# Patient Record
Sex: Female | Born: 1955 | Race: White | Hispanic: No | Marital: Married | Smoking: Never smoker
Health system: Southern US, Community
[De-identification: ages and names within clinical notes are randomized; demographics above are authoritative.]

## PROBLEM LIST (undated history)

## (undated) DIAGNOSIS — M8580 Other specified disorders of bone density and structure, unspecified site: Secondary | ICD-10-CM

## (undated) DIAGNOSIS — T4145XA Adverse effect of unspecified anesthetic, initial encounter: Secondary | ICD-10-CM

## (undated) DIAGNOSIS — M810 Age-related osteoporosis without current pathological fracture: Secondary | ICD-10-CM

## (undated) DIAGNOSIS — Z9889 Other specified postprocedural states: Secondary | ICD-10-CM

## (undated) DIAGNOSIS — T8859XA Other complications of anesthesia, initial encounter: Secondary | ICD-10-CM

## (undated) DIAGNOSIS — M1990 Unspecified osteoarthritis, unspecified site: Secondary | ICD-10-CM

## (undated) DIAGNOSIS — R112 Nausea with vomiting, unspecified: Secondary | ICD-10-CM

## (undated) HISTORY — PX: BREAST SURGERY: SHX581

## (undated) HISTORY — PX: TONSILLECTOMY: SUR1361

---

## 2001-04-06 ENCOUNTER — Ambulatory Visit (HOSPITAL_COMMUNITY): Admission: RE | Admit: 2001-04-06 | Discharge: 2001-04-06 | Payer: Self-pay | Admitting: Family Medicine

## 2001-04-06 ENCOUNTER — Encounter: Payer: Self-pay | Admitting: Family Medicine

## 2001-08-23 ENCOUNTER — Other Ambulatory Visit: Admission: RE | Admit: 2001-08-23 | Discharge: 2001-08-23 | Payer: Self-pay | Admitting: Obstetrics and Gynecology

## 2002-09-25 ENCOUNTER — Other Ambulatory Visit: Admission: RE | Admit: 2002-09-25 | Discharge: 2002-09-25 | Payer: Self-pay | Admitting: Obstetrics and Gynecology

## 2004-04-06 ENCOUNTER — Other Ambulatory Visit: Admission: RE | Admit: 2004-04-06 | Discharge: 2004-04-06 | Payer: Self-pay | Admitting: Obstetrics and Gynecology

## 2005-08-23 ENCOUNTER — Other Ambulatory Visit: Admission: RE | Admit: 2005-08-23 | Discharge: 2005-08-23 | Payer: Self-pay | Admitting: Obstetrics and Gynecology

## 2007-11-10 ENCOUNTER — Emergency Department (HOSPITAL_COMMUNITY): Admission: EM | Admit: 2007-11-10 | Discharge: 2007-11-10 | Payer: Self-pay | Admitting: Family Medicine

## 2011-10-06 ENCOUNTER — Other Ambulatory Visit (HOSPITAL_COMMUNITY): Payer: Self-pay | Admitting: Internal Medicine

## 2011-10-06 ENCOUNTER — Ambulatory Visit (HOSPITAL_COMMUNITY)
Admission: RE | Admit: 2011-10-06 | Discharge: 2011-10-06 | Disposition: A | Discharge status: Home / Self Care | Payer: 59 | Source: Ambulatory Visit | Attending: Internal Medicine | Admitting: Internal Medicine

## 2011-10-06 DIAGNOSIS — T1490XA Injury, unspecified, initial encounter: Secondary | ICD-10-CM

## 2011-10-06 DIAGNOSIS — M25569 Pain in unspecified knee: Secondary | ICD-10-CM

## 2013-09-13 ENCOUNTER — Emergency Department (HOSPITAL_COMMUNITY)
Admission: EM | Admit: 2013-09-13 | Discharge: 2013-09-14 | Disposition: A | Discharge status: Home / Self Care | Payer: 59 | Attending: Emergency Medicine | Admitting: Emergency Medicine

## 2013-09-13 ENCOUNTER — Emergency Department (HOSPITAL_COMMUNITY): Payer: 59

## 2013-09-13 ENCOUNTER — Encounter (HOSPITAL_COMMUNITY): Payer: Self-pay | Admitting: Emergency Medicine

## 2013-09-13 DIAGNOSIS — W19XXXA Unspecified fall, initial encounter: Secondary | ICD-10-CM

## 2013-09-13 DIAGNOSIS — M899 Disorder of bone, unspecified: Secondary | ICD-10-CM | POA: Insufficient documentation

## 2013-09-13 DIAGNOSIS — Z79899 Other long term (current) drug therapy: Secondary | ICD-10-CM | POA: Insufficient documentation

## 2013-09-13 DIAGNOSIS — W1809XA Striking against other object with subsequent fall, initial encounter: Secondary | ICD-10-CM | POA: Insufficient documentation

## 2013-09-13 DIAGNOSIS — Y9389 Activity, other specified: Secondary | ICD-10-CM | POA: Insufficient documentation

## 2013-09-13 DIAGNOSIS — Y92009 Unspecified place in unspecified non-institutional (private) residence as the place of occurrence of the external cause: Secondary | ICD-10-CM | POA: Insufficient documentation

## 2013-09-13 DIAGNOSIS — S43036A Inferior dislocation of unspecified humerus, initial encounter: Secondary | ICD-10-CM | POA: Insufficient documentation

## 2013-09-13 DIAGNOSIS — S43004A Unspecified dislocation of right shoulder joint, initial encounter: Secondary | ICD-10-CM

## 2013-09-13 DIAGNOSIS — S43016A Anterior dislocation of unspecified humerus, initial encounter: Secondary | ICD-10-CM | POA: Insufficient documentation

## 2013-09-13 HISTORY — DX: Other specified disorders of bone density and structure, unspecified site: M85.80

## 2013-09-13 MED ORDER — KETOROLAC TROMETHAMINE 30 MG/ML IJ SOLN
30.0000 mg | Freq: Once | INTRAMUSCULAR | Status: AC
  Administered 2013-09-13: 30 mg via INTRAVENOUS
  Filled 2013-09-13: qty 1

## 2013-09-13 MED ORDER — MORPHINE SULFATE 4 MG/ML IJ SOLN
6.0000 mg | Freq: Once | INTRAMUSCULAR | Status: AC
  Administered 2013-09-13: 6 mg via INTRAVENOUS
  Filled 2013-09-13: qty 2

## 2013-09-13 MED ORDER — PROPOFOL 10 MG/ML IV BOLUS
1.0000 mg/kg | Freq: Once | INTRAVENOUS | Status: AC
  Administered 2013-09-13: 80.7 mg via INTRAVENOUS
  Filled 2013-09-13: qty 1

## 2013-09-13 MED ORDER — MORPHINE SULFATE 4 MG/ML IJ SOLN: 4.0000 mg | Freq: Once | INTRAMUSCULAR | Status: DC

## 2013-09-13 MED ORDER — OXYCODONE-ACETAMINOPHEN 5-325 MG PO TABS
2.0000 | ORAL_TABLET | Freq: Once | ORAL | Status: DC
  Filled 2013-09-13: qty 2

## 2013-09-13 NOTE — ED Provider Notes (Signed)
CSN: [id number]     Arrival date & time 09/13/13  2104 History  This chart was scribed for non-physician practitioner Junius Finner, PA-C working with Audree Camel, MD by Valera Castle, ED scribe. This patient was seen in room WA12/WA12 and the patient's care was started at 9:25 PM.    Chief Complaint  Patient presents with  . Fall  . Arm Injury    HPI HPI Comments: Shannon Andersen is a 57 y.o. female with a h/o osteopenia who presents to the Emergency Department complaining of sudden, throbbing, aching, constant right upper arm pain, with a severity of 8/10, onset 2 hours ago when she fell on stairs while unloading groceries, hitting her elbow on a concrete wall. She reports her arm was bent when she jammed her arm it into the wall, causing some shoulder and wrist pain as well, but that most of the pain is in her upper arm around her elbow. Pt reports taking left over tramadol from old injury PTA with minimal relief.    Movement and palpation make pain worse.  Difficult to get into comfortable position. She denies any head trauma, LOC, numbness, or tingling. She denies any h/o of similar injuries. She denies any fever, or any other associated symptoms. She has an allergy to Sulfa Antibiotics. She denies any other medical history. Pt is right handed.   Past Medical History  Diagnosis Date  . Osteopenia    History reviewed. No pertinent past surgical history. History reviewed. No pertinent family history. History  Substance Use Topics  . Smoking status: Never Smoker   . Smokeless tobacco: Not on file  . Alcohol Use: Not on file     Comment: rarely   OB History   Grav Para Term Preterm Abortions TAB SAB Ect Mult Living                 Review of Systems  Constitutional: Negative for fever.  Musculoskeletal:       Right elbow, wrist, and shoulder pain.   All other systems reviewed and are negative.    Allergies  Sulfa antibiotics  Home Medications   Current Outpatient Rx   Name  Route  Sig  Dispense  Refill  . meloxicam (MOBIC) 15 MG tablet   Oral   Take 15 mg by mouth daily.         Marland Kitchen venlafaxine XR (EFFEXOR-XR) 75 MG 24 hr capsule   Oral   Take 75 mg by mouth daily.         . Vitamin D, Ergocalciferol, (DRISDOL) 50000 UNITS CAPS capsule   Oral   Take 50,000 Units by mouth 2 (two) times a week.         . traMADol (ULTRAM) 50 MG tablet   Oral   Take 1 tablet (50 mg total) by mouth every 6 (six) hours as needed for pain.   20 tablet   0     Triage Vitals: BP 150/88  Pulse 79  Temp(Src) 97.6 F (36.4 C) (Oral)  Resp 22  Ht 5\' 10"  (1.778 m)  Wt 178 lb (80.74 kg)  BMI 25.54 kg/m2  SpO2 100%  Physical Exam  Nursing note and vitals reviewed. Constitutional: She appears well-developed and well-nourished. No distress.  HENT:  Head: Normocephalic and atraumatic.  Eyes: Conjunctivae are normal. No scleral icterus.  Neck: Normal range of motion.  Cardiovascular: Normal rate, regular rhythm and normal heart sounds.   Radial Pulse 2+. Cap Refill less than 2.  Pulmonary/Chest: Effort normal and breath sounds normal. No respiratory distress. She has no wheezes. She has no rales. She exhibits no tenderness.  Abdominal: Soft. Bowel sounds are normal. She exhibits no distension and no mass. There is no tenderness. There is no rebound and no guarding.  Musculoskeletal: She exhibits tenderness. She exhibits no edema.  Deformity noted to right shoulder. Right upper arm no ecchymosis or erythema. Pain with shoulder abduction. Tenderness to posterior right upper arm. Right elbow full ROM without pain. No edema or tenderness.   Neurological: She is alert.  4/5 gip strength due to pain. Normal sensation to right touch.   Skin: Skin is warm and dry. She is not diaphoretic.    ED Course  Procedures (including critical care time)  DIAGNOSTIC STUDIES: Oxygen Saturation is 100% on room air, normal by my interpretation.    COORDINATION OF CARE: 9:28  PM-Discussed treatment plan which includes Oxycodone and right shoulder and elbow x-rays with pt at bedside and pt agreed to plan.   10:16 PM - Re-checked pt's right shoulder. Deformity noted.   Labs Review Labs Reviewed - No data to display Imaging Review Dg Shoulder Right  09/13/2013   CLINICAL DATA:  Fall, right shoulder pain  EXAM: RIGHT SHOULDER - 2+ VIEW  COMPARISON:  None.  FINDINGS: There is anteroinferior dislocation of the right humeral head with respect to the glenoid. No fracture is identified. Right lung apex is clear.  IMPRESSION: Anteroinferior right humeral head dislocation.   Electronically Signed   By: Christiana Pellant M.D.   On: 09/13/2013 22:15   Dg Elbow Complete Right  09/13/2013   CLINICAL DATA:  Fall, right elbow pain  EXAM: RIGHT ELBOW - COMPLETE 3+ VIEW  COMPARISON:  None.  FINDINGS: There is no evidence of fracture, dislocation, or joint effusion. There is no evidence of arthropathy or other focal bone abnormality. Soft tissues are unremarkable  IMPRESSION: Negative.   Electronically Signed   By: Christiana Pellant M.D.   On: 09/13/2013 22:13   Dg Shoulder Right Port  09/14/2013   CLINICAL DATA:  Post reduction exam  EXAM: PORTABLE RIGHT SHOULDER - 2+ VIEW  COMPARISON:  Same date  FINDINGS: Two views demonstrate apparently location of the right humeral head with respect to the glenoid, but positioning is suboptimal.  IMPRESSION: Apparent relocation of right humeral head with respect to the glenoid.   Electronically Signed   By: Christiana Pellant M.D.   On: 09/14/2013 00:20    MDM   1. Shoulder dislocation, right, initial encounter   2. Fall at home, initial encounter    Pt presented with right shoulder dislocation after fall at home. Denies head trauma or LOC.  Denies any other injuries.  Shoulder was reduced by Dr. Criss Alvine, see his procedure note.  Pt tolerated procedure well and placed in a sling.  Advised to f/u with [facility] next week for recheck.  Rx:  tramadol.  All labs/imaging/findings discussed with patient. All questions answered and concerns addressed. Return precautions given. Pt verbalized understanding and agreement with tx plan. Vitals: unremarkable. Discharged in stable condition.    Discussed pt with attending during ED encounter and agrees with plan.   I personally performed the services described in this documentation, which was scribed in my presence. The recorded information has been reviewed and is accurate.    Junius Finner, PA-C 09/14/13 [phone]

## 2013-09-13 NOTE — ED Notes (Signed)
ACTUAL SEDATION STARTS at this time .

## 2013-09-13 NOTE — ED Notes (Signed)
PA at bedside.

## 2013-09-13 NOTE — ED Notes (Signed)
From home , pt. Stated she tripped on something on her garage this evening at 7:30 and stumbled and hit her right elbow against a concrete wall  Before hitting on the garage floor. Denies LOC. Pain is @ 10/10. No skin lacerations nor abrasions noted at this time.

## 2013-09-13 NOTE — ED Notes (Signed)
Patient transported to X-ray 

## 2013-09-13 NOTE — ED Notes (Signed)
MD at bedside. 

## 2013-09-14 MED ORDER — TRAMADOL HCL 50 MG PO TABS: 50.0000 mg | ORAL_TABLET | Freq: Four times a day (QID) | ORAL | Status: DC | PRN

## 2013-09-14 NOTE — ED Provider Notes (Signed)
Medical screening examination/treatment/procedure(s) were conducted as a shared visit with non-physician practitioner(s) and myself.  I personally evaluated the patient during the encounter   Patient with a mechanical fall resulting in a right shoulder dislocation. NV intact. Patient's shoulder reduced by me after conscious sedation with propofol. Placed in a sling and will arrange outpatient ortho f/u.   Shoulder Reduction Procedure Note  Under conscious sedation using propofol, the right shoulder was reduced with the patient sitting up. The Traction-Countertraction maneuver was used. Reduction was successful and confirmed by post reduction X-Rays. Repeat exam of the extremity reveals normal sensation and circulation.  Post-Reduction X-rays reveal that the proximal humerus is reduced in the glenoid fossa.   Audree Camel, MD 09/14/13 [phone]

## 2013-09-16 ENCOUNTER — Other Ambulatory Visit (HOSPITAL_COMMUNITY): Payer: Self-pay | Admitting: Orthopaedic Surgery

## 2013-09-16 DIAGNOSIS — S43004A Unspecified dislocation of right shoulder joint, initial encounter: Secondary | ICD-10-CM

## 2013-09-17 ENCOUNTER — Ambulatory Visit (HOSPITAL_COMMUNITY)
Admission: RE | Admit: 2013-09-17 | Discharge: 2013-09-17 | Disposition: A | Discharge status: Home / Self Care | Payer: 59 | Source: Ambulatory Visit | Attending: Orthopaedic Surgery | Admitting: Orthopaedic Surgery

## 2013-09-17 DIAGNOSIS — IMO0002 Reserved for concepts with insufficient information to code with codable children: Secondary | ICD-10-CM | POA: Insufficient documentation

## 2013-09-17 DIAGNOSIS — S42143A Displaced fracture of glenoid cavity of scapula, unspecified shoulder, initial encounter for closed fracture: Secondary | ICD-10-CM | POA: Insufficient documentation

## 2013-09-17 DIAGNOSIS — S43499A Other sprain of unspecified shoulder joint, initial encounter: Secondary | ICD-10-CM | POA: Insufficient documentation

## 2013-09-17 DIAGNOSIS — S42293A Other displaced fracture of upper end of unspecified humerus, initial encounter for closed fracture: Secondary | ICD-10-CM | POA: Insufficient documentation

## 2013-09-17 DIAGNOSIS — W19XXXA Unspecified fall, initial encounter: Secondary | ICD-10-CM | POA: Insufficient documentation

## 2013-09-17 DIAGNOSIS — S43004A Unspecified dislocation of right shoulder joint, initial encounter: Secondary | ICD-10-CM

## 2013-09-17 DIAGNOSIS — M75100 Unspecified rotator cuff tear or rupture of unspecified shoulder, not specified as traumatic: Secondary | ICD-10-CM | POA: Insufficient documentation

## 2013-09-17 DIAGNOSIS — M8569 Other cyst of bone, multiple sites: Secondary | ICD-10-CM | POA: Insufficient documentation

## 2013-09-17 DIAGNOSIS — M19019 Primary osteoarthritis, unspecified shoulder: Secondary | ICD-10-CM | POA: Insufficient documentation

## 2013-09-17 DIAGNOSIS — M25419 Effusion, unspecified shoulder: Secondary | ICD-10-CM | POA: Insufficient documentation

## 2013-09-26 ENCOUNTER — Encounter (HOSPITAL_COMMUNITY): Payer: Self-pay | Admitting: Pharmacy Technician

## 2013-09-26 NOTE — Pre-Procedure Instructions (Signed)
FRANCHELLE FOSKETT  09/26/2013   Your procedure is scheduled on: Tuesday, October 21st   Report to [facility]  2 * 3 at 11:00 AM.   Call this number if you have problems the morning of surgery: [phone]   Remember:   Do not eat food or drink liquids after midnight Monday.   Take these medicines the morning of surgery with A SIP OF WATER: Tramadol, Effexor   Do not wear jewelry, make-up or nail polish.  Do not wear lotions, powders, or perfumes. You may wear deodorant.  Do not shave underarms & legs 48 hours prior to surgery.                    Do not bring valuables to the hospital.  [facility] is not responsible for any belongings or valuables.               Contacts, dentures or bridgework may not be worn into surgery.  Leave suitcase in the car. After surgery it may be brought to your room.  For patients admitted to the hospital, discharge time is determined by your treatment team.              Name and phone number of your driver:    Special Instructions: Shower using CHG 2 nights before surgery and the night before surgery.  If you shower the day of surgery use CHG.  Use special wash - you have one bottle of CHG for all showers.  You should use approximately 1/3 of the bottle for each shower.   Please read over the following fact sheets that you were given: Pain Booklet, Coughing and Deep Breathing, MRSA Information and Surgical Site Infection Prevention

## 2013-09-27 ENCOUNTER — Encounter (HOSPITAL_COMMUNITY): Payer: Self-pay

## 2013-09-27 ENCOUNTER — Encounter (HOSPITAL_COMMUNITY)
Admission: RE | Admit: 2013-09-27 | Discharge: 2013-09-27 | Disposition: A | Discharge status: Home / Self Care | Payer: 59 | Source: Ambulatory Visit | Attending: Orthopedic Surgery | Admitting: Orthopedic Surgery

## 2013-09-27 ENCOUNTER — Encounter (HOSPITAL_COMMUNITY)
Admission: RE | Admit: 2013-09-27 | Discharge: 2013-09-27 | Disposition: A | Discharge status: Home / Self Care | Payer: 59 | Source: Ambulatory Visit | Attending: Orthopaedic Surgery | Admitting: Orthopaedic Surgery

## 2013-09-27 DIAGNOSIS — Z01812 Encounter for preprocedural laboratory examination: Secondary | ICD-10-CM | POA: Insufficient documentation

## 2013-09-27 DIAGNOSIS — Z01810 Encounter for preprocedural cardiovascular examination: Secondary | ICD-10-CM | POA: Insufficient documentation

## 2013-09-27 DIAGNOSIS — Z01818 Encounter for other preprocedural examination: Secondary | ICD-10-CM | POA: Insufficient documentation

## 2013-09-27 HISTORY — DX: Adverse effect of unspecified anesthetic, initial encounter: T41.45XA

## 2013-09-27 HISTORY — DX: Other complications of anesthesia, initial encounter: T88.59XA

## 2013-09-27 HISTORY — DX: Other specified postprocedural states: Z98.890

## 2013-09-27 HISTORY — DX: Age-related osteoporosis without current pathological fracture: M81.0

## 2013-09-27 HISTORY — DX: Other specified postprocedural states: R11.2

## 2013-09-27 LAB — COMPREHENSIVE METABOLIC PANEL
ALT: 20 U/L (ref 0–35)
AST: 17 U/L (ref 0–37)
Albumin: 3.9 g/dL (ref 3.5–5.2)
Alkaline Phosphatase: 107 U/L (ref 39–117)
BUN: 22 mg/dL (ref 6–23)
CO2: 26 mEq/L (ref 19–32)
Calcium: 9.6 mg/dL (ref 8.4–10.5)
Chloride: 106 mEq/L (ref 96–112)
Creatinine, Ser: 0.73 mg/dL (ref 0.50–1.10)
GFR calc Af Amer: >90 mL/min (ref >90)
GFR calc non Af Amer: >90 mL/min (ref >90)
Glucose, Bld: 93 mg/dL (ref 70–99)
Potassium: 4.2 mEq/L (ref 3.5–5.1)
Sodium: 142 mEq/L (ref 135–145)
Total Bilirubin: 0.2 mg/dL — ABNORMAL LOW (ref 0.3–1.2)
Total Protein: 7.4 g/dL (ref 6.0–8.3)

## 2013-09-27 LAB — URINALYSIS, ROUTINE W REFLEX MICROSCOPIC
Bilirubin Urine: NEGATIVE
Glucose, UA: NEGATIVE mg/dL
Hgb urine dipstick: NEGATIVE
Ketones, ur: NEGATIVE mg/dL
Leukocytes, UA: NEGATIVE
Nitrite: NEGATIVE
Protein, ur: NEGATIVE mg/dL
Specific Gravity, Urine: 1.022 (ref 1.005–1.030)
Urobilinogen, UA: 0.2 mg/dL (ref 0.0–1.0)
pH: 6 (ref 5.0–8.0)

## 2013-09-27 LAB — CBC
HCT: 40.7 % (ref 36.0–46.0)
Hemoglobin: 13.7 g/dL (ref 12.0–15.0)
MCH: 28.9 pg (ref 26.0–34.0)
MCHC: 33.7 g/dL (ref 30.0–36.0)
MCV: 85.9 fL (ref 78.0–100.0)
Platelets: 205 10*3/uL (ref 150–400)
RBC: 4.74 MIL/uL (ref 3.87–5.11)
RDW: 13.7 % (ref 11.5–15.5)
WBC: 8.6 10*3/uL (ref 4.0–10.5)

## 2013-09-27 LAB — APTT: aPTT: 30 seconds (ref 24–37)

## 2013-09-27 LAB — PROTIME-INR
INR: 1.02 (ref 0.00–1.49)
Prothrombin Time: 13.2 seconds (ref 11.6–15.2)

## 2013-09-27 NOTE — Progress Notes (Addendum)
No orders at PAT--consent to be done DOS....Marland KitchenMarland KitchenDA Orders received just moments from patient leaving PAT!!!  DA

## 2013-09-30 MED ORDER — CEFAZOLIN SODIUM-DEXTROSE 2-3 GM-% IV SOLR
2.0000 g | INTRAVENOUS | Status: AC
  Administered 2013-10-01: 2 g via INTRAVENOUS
  Filled 2013-09-30: qty 50

## 2013-09-30 NOTE — H&P (Signed)
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 and she had tripped in the garage and fell on to her left side striking the right side of her body against a wall.  Apparently she sustained an anteroinferior dislocation of her shoulder, went to [facility] Room, underwent a closed reduction about 3 hours or so after the episode.  She did have some numbness in the lateral aspect of her arm which has continued.  She was placed into a sling and given tramadol.  Saw in office on 09/16/2013 and an MRI was ordered and she was seen in follow-up.    MRI scan reveals that she has a bony Bankart.  It is about 2 cm long and maybe a 3 mm piece of bone, although it could be larger as the MRI scan often times cannot distinguish between edema and the bony fragment.  There was some debris within the joint, but the rotator cuff was intact. It was felt that the best approach would be an arthroscopic evaluation to debride the joint and then consider repairing the anterior labrum.    PAST MEDICAL HISTORY: Patient Active Problem List   Diagnosis Date Noted  . Dislocation, shoulder, anterior 10/01/2013  . 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 Osteopenia   . Complication of anesthesia   . PONV (postoperative nausea and vomiting)   . Osteoporosis     STIFFNESS IN HANDS   Past Surgical History  Procedure Laterality Date  . Breast surgery Left     BIOPSY-BENIGN     MEDICATIONS:   Prescriptions prior to admission  Medication Sig Dispense Refill  . meloxicam (MOBIC) 15 MG tablet Take 15 mg by  mouth daily.      . traMADol (ULTRAM) 50 MG tablet Take 1 tablet (50 mg total) by mouth every 6 (six) hours as needed for pain.  20 tablet  0  . venlafaxine XR (EFFEXOR-XR) 75 MG 24 hr capsule Take 75 mg by mouth daily.      . Vitamin D, Ergocalciferol, (DRISDOL) 50000 UNITS CAPS capsule Take 50,000 Units by mouth 2 (two) times a week.        ALLERGIES:   Allergies  Allergen Reactions  . Sulfa Antibiotics     REVIEW OF SYSTEMS:  A comprehensive review of systems was negative.   FAMILY HISTORY:  History reviewed. No pertinent family history.  SOCIAL HISTORY:   History  Substance Use Topics  . Smoking status: Never Smoker   . Smokeless tobacco: Never Used  . Alcohol Use: Not on file     Comment: rarely      EXAMINATION: Vital signs in last 24 hours: See chart Head is normocephalic.   Eyes:  Pupils equal, round and reactive to light and accommodation.  Extraocular intact. ENT: Ears, nose, and throat were benign.   Neck: supple, no bruits were noted.   Chest: good expansion.   Lungs: essentially clear.  Cardiac: regular rhythm and rate, normal S1, S2.  No murmurs appreciated. Pulses :  1+ bilateral and symmetric in upper extremities. Abdomen is scaphoid, soft, nontender, no masses palpable, normal bowel sounds  present. CNS:  He is oriented x3 and cranial nerves II-XII grossly intact. Breast, rectal, and genital exams: not performed and not indicated for an orthopedic evaluation. Musculoskeletal: she clinically appears to be reduced.  No sulcus sign.  She is diffusely tender about the shoulder.  She has smooth motion, but just did a limited exam.  She does have decreased sensation  the axillary nerve to the lateral humeral area.  She has good radial pulses.  Radial, ulnar, median, interosseous nerve is intact distally.  Imaging Review MRI scan reveals that she has a bony Bankart.  It is about 2 cm long and maybe a 3 mm piece of bone, although it could be larger as the MRI  scan often times cannot distinguish between edema and the bony fragment.  There was some debris within the joint, but the rotator cuff was intact.    ASSESSMENT: Traumatic dislocation Right shoulder with bony bankart lesion and labral tear Past Medical History  Diagnosis Date  . Osteopenia   . Complication of anesthesia   . PONV (postoperative nausea and vomiting)   . Osteoporosis     STIFFNESS IN HANDS    PLAN: Plan for right arthroscopic evaluation to debride the joint and then consider repairing the anterior labrum, possible SAD.  The procedure,  risks, and benefits of total knee arthroplasty were presented and reviewed. The risks including but not limited to infection, blood clots, vascular and nerve injury, stiffness,  among others were discussed. The patient acknowledged the explanation, agreed to proceed.   Shannon Andersen 10/01/2013, 12:56 PM

## 2013-10-01 ENCOUNTER — Ambulatory Visit (HOSPITAL_COMMUNITY): Payer: 59 | Admitting: Certified Registered"

## 2013-10-01 ENCOUNTER — Encounter (HOSPITAL_COMMUNITY)
Admission: RE | Disposition: A | Discharge status: Home / Self Care | Payer: Self-pay | Source: Ambulatory Visit | Attending: Orthopaedic Surgery

## 2013-10-01 ENCOUNTER — Encounter (HOSPITAL_COMMUNITY): Payer: Self-pay | Admitting: *Deleted

## 2013-10-01 ENCOUNTER — Encounter (HOSPITAL_COMMUNITY): Payer: 59 | Admitting: Certified Registered"

## 2013-10-01 ENCOUNTER — Ambulatory Visit (HOSPITAL_COMMUNITY)
Admission: RE | Admit: 2013-10-01 | Discharge: 2013-10-01 | Disposition: A | Discharge status: Home / Self Care | Payer: 59 | Source: Ambulatory Visit | Attending: Orthopaedic Surgery | Admitting: Orthopaedic Surgery

## 2013-10-01 DIAGNOSIS — S43499A Other sprain of unspecified shoulder joint, initial encounter: Secondary | ICD-10-CM | POA: Insufficient documentation

## 2013-10-01 DIAGNOSIS — Z01812 Encounter for preprocedural laboratory examination: Secondary | ICD-10-CM | POA: Insufficient documentation

## 2013-10-01 DIAGNOSIS — W19XXXA Unspecified fall, initial encounter: Secondary | ICD-10-CM | POA: Insufficient documentation

## 2013-10-01 DIAGNOSIS — S43016A Anterior dislocation of unspecified humerus, initial encounter: Secondary | ICD-10-CM

## 2013-10-01 DIAGNOSIS — Z79899 Other long term (current) drug therapy: Secondary | ICD-10-CM | POA: Insufficient documentation

## 2013-10-01 DIAGNOSIS — S43014D Anterior dislocation of right humerus, subsequent encounter: Secondary | ICD-10-CM

## 2013-10-01 DIAGNOSIS — S43439A Superior glenoid labrum lesion of unspecified shoulder, initial encounter: Secondary | ICD-10-CM

## 2013-10-01 HISTORY — PX: BANKART REPAIR: SHX5173

## 2013-10-01 SURGERY — REPAIR, SHOULDER, ARTHROSCOPIC, BANKART
Anesthesia: General | Site: Shoulder | Laterality: Right | Wound class: Clean

## 2013-10-01 MED ORDER — DEXAMETHASONE SODIUM PHOSPHATE 10 MG/ML IJ SOLN
INTRAMUSCULAR | Status: DC | PRN
  Administered 2013-10-01: 4 mg via INTRAVENOUS

## 2013-10-01 MED ORDER — BUPIVACAINE HCL (PF) 0.25 % IJ SOLN
INTRAMUSCULAR | Status: AC
  Filled 2013-10-01: qty 30

## 2013-10-01 MED ORDER — CHLORHEXIDINE GLUCONATE 4 % EX LIQD: 60.0000 mL | Freq: Every day | CUTANEOUS | Status: DC

## 2013-10-01 MED ORDER — LACTATED RINGERS IV SOLN
INTRAVENOUS | Status: DC | PRN
  Administered 2013-10-01 (×2): via INTRAVENOUS

## 2013-10-01 MED ORDER — FENTANYL CITRATE 0.05 MG/ML IJ SOLN
50.0000 ug | Freq: Once | INTRAMUSCULAR | Status: DC
  Filled 2013-10-01: qty 2

## 2013-10-01 MED ORDER — LIDOCAINE HCL (CARDIAC) 20 MG/ML IV SOLN
INTRAVENOUS | Status: DC | PRN
  Administered 2013-10-01: 80 mg via INTRAVENOUS

## 2013-10-01 MED ORDER — OXYCODONE HCL 5 MG PO TABS: 5.0000 mg | ORAL_TABLET | ORAL | Status: DC | PRN

## 2013-10-01 MED ORDER — DEXAMETHASONE SODIUM PHOSPHATE 4 MG/ML IJ SOLN
INTRAMUSCULAR | Status: DC | PRN
  Administered 2013-10-01: 4 mg

## 2013-10-01 MED ORDER — LACTATED RINGERS IV SOLN: INTRAVENOUS | Status: DC

## 2013-10-01 MED ORDER — EPHEDRINE SULFATE 50 MG/ML IJ SOLN
INTRAMUSCULAR | Status: DC | PRN
  Administered 2013-10-01: 10 mg via INTRAVENOUS
  Administered 2013-10-01 (×3): 5 mg via INTRAVENOUS
  Administered 2013-10-01: 10 mg via INTRAVENOUS

## 2013-10-01 MED ORDER — LIDOCAINE-EPINEPHRINE (PF) 1 %-1:200000 IJ SOLN
INTRAMUSCULAR | Status: AC
  Filled 2013-10-01: qty 10

## 2013-10-01 MED ORDER — SODIUM CHLORIDE 0.9 % IR SOLN
Status: DC | PRN
  Administered 2013-10-01 (×6): 3000 mL

## 2013-10-01 MED ORDER — SODIUM CHLORIDE 0.9 % IV SOLN: INTRAVENOUS | Status: DC

## 2013-10-01 MED ORDER — PHENYLEPHRINE HCL 10 MG/ML IJ SOLN
10.0000 mg | INTRAVENOUS | Status: DC | PRN
  Administered 2013-10-01: 40 ug/min via INTRAVENOUS

## 2013-10-01 MED ORDER — CHLORHEXIDINE GLUCONATE 4 % EX LIQD: 60.0000 mL | Freq: Once | CUTANEOUS | Status: DC

## 2013-10-01 MED ORDER — PHENYLEPHRINE HCL 10 MG/ML IJ SOLN
INTRAMUSCULAR | Status: DC | PRN
  Administered 2013-10-01 (×6): 40 ug via INTRAVENOUS

## 2013-10-01 MED ORDER — ROCURONIUM BROMIDE 100 MG/10ML IV SOLN
INTRAVENOUS | Status: DC | PRN
  Administered 2013-10-01: 30 mg via INTRAVENOUS

## 2013-10-01 MED ORDER — MIDAZOLAM HCL 2 MG/2ML IJ SOLN: 1.0000 mg | INTRAMUSCULAR | Status: DC | PRN

## 2013-10-01 MED ORDER — FENTANYL CITRATE 0.05 MG/ML IJ SOLN
50.0000 ug | INTRAMUSCULAR | Status: DC | PRN
  Administered 2013-10-01 (×2): 50 ug via INTRAVENOUS

## 2013-10-01 MED ORDER — MIDAZOLAM HCL 2 MG/2ML IJ SOLN
1.0000 mg | INTRAMUSCULAR | Status: DC | PRN
  Administered 2013-10-01: 2 mg via INTRAVENOUS

## 2013-10-01 MED ORDER — PROPOFOL 10 MG/ML IV BOLUS
INTRAVENOUS | Status: DC | PRN
  Administered 2013-10-01: 180 mg via INTRAVENOUS

## 2013-10-01 MED ORDER — ONDANSETRON HCL 4 MG/2ML IJ SOLN
INTRAMUSCULAR | Status: DC | PRN
  Administered 2013-10-01: 4 mg via INTRAVENOUS

## 2013-10-01 MED ORDER — ACETAMINOPHEN 500 MG PO TABS
1000.0000 mg | ORAL_TABLET | Freq: Once | ORAL | Status: AC
  Administered 2013-10-01: 1000 mg via ORAL
  Filled 2013-10-01: qty 2

## 2013-10-01 MED ORDER — BUPIVACAINE-EPINEPHRINE PF 0.5-1:200000 % IJ SOLN
INTRAMUSCULAR | Status: DC | PRN
  Administered 2013-10-01: 25 mL

## 2013-10-01 SURGICAL SUPPLY — 57 items
2.9 Suture Anchor (Anchor) ×5 IMPLANT
ANCH SUT PUSHLCK 19.5X3.5 STRL (Anchor) ×1 IMPLANT
ANCHOR PUSHLOCK PEEK 3.5X19.5 (Anchor) ×1 IMPLANT
APL SKNCLS STERI-STRIP NONHPOA (GAUZE/BANDAGES/DRESSINGS) ×1
BENZOIN TINCTURE PRP APPL 2/3 (GAUZE/BANDAGES/DRESSINGS) ×1 IMPLANT
BLADE GREAT WHITE 4.2 (BLADE) ×2 IMPLANT
BLADE SURG 11 STRL SS (BLADE) ×2 IMPLANT
BUR OVAL 4.0 (BURR) ×2 IMPLANT
BUR OVAL 6.0 (BURR) ×2 IMPLANT
CANNULA 5.75X71 LONG (CANNULA) ×1 IMPLANT
CANNULA ACUFLEX KIT 5X76 (CANNULA) ×2 IMPLANT
CANNULA TWIST IN 8.25X7CM (CANNULA) ×1 IMPLANT
CLOSURE STERI-STRIP 1/4X4 (GAUZE/BANDAGES/DRESSINGS) ×1 IMPLANT
COVER SURGICAL LIGHT HANDLE (MISCELLANEOUS) ×2 IMPLANT
DRAPE SHOULDER BEACH CHAIR (DRAPES) ×2 IMPLANT
DRAPE STERI 35X30 U-POUCH (DRAPES) ×2 IMPLANT
DRSG EMULSION OIL 3X3 NADH (GAUZE/BANDAGES/DRESSINGS) ×2 IMPLANT
DRSG PAD ABDOMINAL 8X10 ST (GAUZE/BANDAGES/DRESSINGS) ×2 IMPLANT
DURAPREP 26ML APPLICATOR (WOUND CARE) ×2 IMPLANT
ELECT REM PT RETURN 9FT ADLT (ELECTROSURGICAL) ×2
ELECTRODE REM PT RTRN 9FT ADLT (ELECTROSURGICAL) ×1 IMPLANT
FIBERSTICK 2 (SUTURE) ×1 IMPLANT
GLOVE BIOGEL PI IND STRL 8 (GLOVE) ×1 IMPLANT
GLOVE BIOGEL PI IND STRL 8.5 (GLOVE) IMPLANT
GLOVE BIOGEL PI INDICATOR 8 (GLOVE) ×1
GLOVE BIOGEL PI INDICATOR 8.5 (GLOVE) ×1
GLOVE ECLIPSE 8.0 STRL XLNG CF (GLOVE) ×2 IMPLANT
GLOVE SURG ORTHO 8.5 STRL (GLOVE) ×1 IMPLANT
GOWN PREVENTION PLUS XXLARGE (GOWN DISPOSABLE) ×2 IMPLANT
GOWN STRL NON-REIN LRG LVL3 (GOWN DISPOSABLE) ×5 IMPLANT
KIT DISPOSABLE PUSHLOCK 2.9MM (KITS) ×1 IMPLANT
KIT ROOM TURNOVER OR (KITS) ×2 IMPLANT
LABRAL TAPE ×3 IMPLANT
LASSO 90 CVE QUICKPAS (DISPOSABLE) ×1 IMPLANT
MANIFOLD NEPTUNE II (INSTRUMENTS) ×2 IMPLANT
NDL SPNL 18GX3.5 QUINCKE PK (NEEDLE) ×1 IMPLANT
NEEDLE 22X1 1/2 (OR ONLY) (NEEDLE) ×2 IMPLANT
NEEDLE SPNL 18GX3.5 QUINCKE PK (NEEDLE) ×2 IMPLANT
NS IRRIG 1000ML POUR BTL (IV SOLUTION) ×2 IMPLANT
PACK SHOULDER (CUSTOM PROCEDURE TRAY) ×2 IMPLANT
PAD ARMBOARD 7.5X6 YLW CONV (MISCELLANEOUS) ×4 IMPLANT
SET ARTHROSCOPY TUBING (MISCELLANEOUS) ×2
SET ARTHROSCOPY TUBING LN (MISCELLANEOUS) ×1 IMPLANT
SLING ARM IMMOBILIZER LRG (SOFTGOODS) ×1 IMPLANT
SPONGE GAUZE 4X4 12PLY (GAUZE/BANDAGES/DRESSINGS) ×2 IMPLANT
SUT ETHILON 4 0 PS 2 18 (SUTURE) ×2 IMPLANT
SUT MNCRL AB 3-0 PS2 18 (SUTURE) ×2 IMPLANT
SUT VIC AB 0 CT1 27 (SUTURE) ×2
SUT VIC AB 0 CT1 27XBRD ANBCTR (SUTURE) ×1 IMPLANT
SUT VICRYL 0 UR6 27IN ABS (SUTURE) ×2 IMPLANT
SYR 20ML ECCENTRIC (SYRINGE) ×2 IMPLANT
SYR CONTROL 10ML LL (SYRINGE) ×2 IMPLANT
TAPE CLOTH SURG 4X10 WHT LF (GAUZE/BANDAGES/DRESSINGS) ×1 IMPLANT
TAPE LABRALWHITE 1.5X36 (TAPE) ×1 IMPLANT
TOWEL OR 17X24 6PK STRL BLUE (TOWEL DISPOSABLE) ×2 IMPLANT
TOWEL OR 17X26 10 PK STRL BLUE (TOWEL DISPOSABLE) ×2 IMPLANT
WAND 90 DEG TURBOVAC W/CORD (SURGICAL WAND) ×2 IMPLANT

## 2013-10-01 NOTE — Preoperative (Signed)
2Beta Blockers   Reason not to administer Beta Blockers:Not Applicable 

## 2013-10-01 NOTE — Op Note (Signed)
PATIENT ID:      KENZIE THORESON  MRN:     [id number] DOB/AGE:    [birth year] / 57 y.o.       OPERATIVE REPORT    DATE OF PROCEDURE:  10/01/2013       PREOPERATIVE DIAGNOSIS:   Status Post Anteroinferior dislocation with Bony Bankart/Labral Tear. Right shoulder                                                       Estimated body mass index is 25.54 kg/(m^2) as calculated from the following:   Height as of 09/27/13: 5\' 10"  (1.778 m).   Weight as of 09/13/13: 80.74 kg (178 lb).     POSTOPERATIVE DIAGNOSIS:   Status Post Anteroinferior dislocation with bony Bankart/labral tear right shoulder                                                                      Estimated body mass index is 25.54 kg/(m^2) as calculated from the following:   Height as of 09/27/13: 5\' 10"  (1.778 m).   Weight as of 09/13/13: 80.74 kg (178 lb).     PROCEDURE:  Procedure(s): Right Shoulder Arthroscopic Debridement with Labral Repair      SURGEON:  Norlene Campbell, MD    ASSISTANT:   [name], PA-C   (Present and scrubbed throughout the case, critical for assistance with exposure, retraction, instrumentation, and closure.)          ANESTHESIA: regional and general     DRAINS: none :      TOURNIQUET TIME: * No tourniquets in log *    COMPLICATIONS:  None   CONDITION:  stable  PROCEDURE IN DETAIL: 213086   Kalik Hoare W 10/01/2013, 3:18 PM

## 2013-10-01 NOTE — H&P (Signed)
  The recent History & Physical has been reviewed. I have personally examined the patient today. There is no interval change to the documented History & Physical. The patient would like to proceed with the procedure.  Norlene Campbell W 10/01/2013,  12:54 PM

## 2013-10-01 NOTE — Transfer of Care (Signed)
Immediate Anesthesia Transfer of Care Note  Patient: Shannon Andersen  Procedure(s) Performed: Procedure(s): Right Shoulder Arthroscopic Debridement with Labral Repair (Right)  Patient Location: PACU  Anesthesia Type:GA combined with regional for post-op pain  Level of Consciousness: awake  Airway & Oxygen Therapy: Patient Spontanous Breathing and Patient connected to nasal cannula oxygen  Post-op Assessment: Report given to PACU RN, Post -op Vital signs reviewed and stable and Patient moving all extremities  Post vital signs: Reviewed and stable  Complications: No apparent anesthesia complications

## 2013-10-01 NOTE — Anesthesia Preprocedure Evaluation (Signed)
Anesthesia Evaluation  Patient identified by MRN, date of birth, ID band Patient awake    Reviewed: Allergy & Precautions, H&P , NPO status , Patient's Chart, lab work & pertinent test results  History of Anesthesia Complications (+) PONV  Airway Mallampati: I TM Distance: >3 FB Neck ROM: Full    Dental   Pulmonary  breath sounds clear to auscultation        Cardiovascular Rhythm:Regular Rate:Normal     Neuro/Psych    GI/Hepatic   Endo/Other    Renal/GU      Musculoskeletal   Abdominal   Peds  Hematology   Anesthesia Other Findings   Reproductive/Obstetrics                           Anesthesia Physical Anesthesia Plan  ASA: I  Anesthesia Plan: General   Post-op Pain Management:    Induction: Intravenous  Airway Management Planned: Oral ETT  Additional Equipment:   Intra-op Plan:   Post-operative Plan: Extubation in OR  Informed Consent: I have reviewed the patients History and Physical, chart, labs and discussed the procedure including the risks, benefits and alternatives for the proposed anesthesia with the patient or authorized representative who has indicated his/her understanding and acceptance.     Plan Discussed with: CRNA and Surgeon  Anesthesia Plan Comments:         Anesthesia Quick Evaluation

## 2013-10-01 NOTE — Anesthesia Procedure Notes (Addendum)
Anesthesia Regional Block:  Interscalene brachial plexus block  Pre-Anesthetic Checklist: ,, timeout performed, Correct Patient, Correct Site, Correct Laterality, Correct Procedure, Correct Position, site marked, Risks and benefits discussed,  Surgical consent,  Pre-op evaluation,  At surgeon's request and post-op pain management  Laterality: Right  Prep: chloraprep       Needles:  Injection technique: Single-shot  Needle Type: Echogenic Stimulator Needle     Needle Length: 5cm 5 cm Needle Gauge: 22 and 22 G    Additional Needles:  Procedures: ultrasound guided (picture in chart) and nerve stimulator Interscalene brachial plexus block  Nerve Stimulator or Paresthesia:  Response: 0.48 mA,   Additional Responses:   Narrative:  Start time: 10/01/2013 12:05 PM End time: 10/01/2013 12:12 PM Injection made incrementally with aspirations every 3 mL. Anesthesiologist: Dr Gypsy Balsam  Additional Notes: 1610-9604 R ISB POP CHG prep, sterile tech #22 stim/echo needle with stim down to .48ma and good [place] visualization-PIX in chart Marc .5% w/epi 1:200000 25cc+decadron 4mg  infiltrated Multiple neg asp No compl Dr Gypsy Balsam   Procedure Name: Intubation Date/Time: 10/01/2013 1:37 PM Performed by: Valeria Batman Pre-anesthesia Checklist: Patient identified, Emergency Drugs available, Suction available, Patient being monitored and Timeout performed Patient Re-evaluated:Patient Re-evaluated prior to inductionOxygen Delivery Method: Circle system utilized and Simple face mask Preoxygenation: Pre-oxygenation with 100% oxygen Intubation Type: IV induction Ventilation: Mask ventilation without difficulty Laryngoscope Size: Mac and 3 Grade View: Grade II Tube type: Oral Tube size: 7.0 mm Number of attempts: 1 Airway Equipment and Method: Patient positioned with wedge pillow and Stylet Placement Confirmation: ETT inserted through vocal cords under direct vision,  positive ETCO2 and  breath sounds checked- equal and bilateral Secured at: 22 cm Tube secured with: Tape Dental Injury: Teeth and Oropharynx as per pre-operative assessment

## 2013-10-01 NOTE — Anesthesia Postprocedure Evaluation (Signed)
  Anesthesia Post-op Note  Patient: Shannon Andersen  Procedure(s) Performed: Procedure(s): Right Shoulder Arthroscopic Debridement with Labral Repair (Right)  Patient Location: PACU  Anesthesia Type:GA combined with regional for post-op pain  Level of Consciousness: awake  Airway and Oxygen Therapy: Patient Spontanous Breathing  Post-op Pain: none  Post-op Assessment: Post-op Vital signs reviewed, Patient's Cardiovascular Status Stable, Respiratory Function Stable, Patent Airway, No signs of Nausea or vomiting and Pain level controlled  Post-op Vital Signs: Reviewed and stable  Complications: No apparent anesthesia complications

## 2013-10-02 NOTE — Op Note (Signed)
NAMEMarland Kitchen  Shannon, Andersen NO.:  [id number]  MEDICAL RECORD NO.:  [id number]  LOCATION:  [facility]                         FACILITY:  [facility]  PHYSICIAN:  Claude Manges. Whitfield, M.D.DATE OF BIRTH:  1956/08/29  DATE OF PROCEDURE:  10/01/2013 DATE OF DISCHARGE:  10/01/2013                              OPERATIVE REPORT   PREOPERATIVE DIAGNOSIS:  Status post anterior dislocation right shoulder with bony Bankart and labral tear.  POSTOPERATIVE DIAGNOSIS:  Status post anterior dislocation right shoulder with bony Bankart and labral tear.  PROCEDURE: 1. Diagnostic arthroscopy, right shoulder with debridement of     osteocartilaginous loose bodies. 2. Arthroscopic anterior labral repair.  SURGEON:  Claude Manges. Cleophas Dunker, MD  ASSISTANT:  Oris Drone. Petrarca, PA-C  ANESTHESIA:  General with supplemental interscalene nerve block.  COMPLICATIONS:  None.  HISTORY:  A 57 year old female who is just over 2 weeks status post anterior dislocation of the right shoulder reduced in the emergency room.  She was seen post reduction in the office with an MRI.  Follow up MRI scan revealing a large bony Bankart lesion, measuring about 3 mm in width and 2 cm in length.  It is associated with a hemarthrosis and some debris within the shoulder joint.  Based on the pathology, she is now to have an arthroscopic evaluation of the labral repair.  PROCEDURE:  Shannon Andersen was met with her husband in the holding area, identified the right shoulder as the appropriate operative site marked accordingly.  She did receive a preoperative interscalene nerve block per Anesthesia.  The patient was then transferred to room #9 and placed under general anesthesia without difficulty.  In the shoulder frame, she was placed in a semi-sitting position.  Examination of the shoulder revealed no evidence of adhesive capsulitis. I could not sublux the shoulder posteriorly and was careful about even attempting an anterior  maneuver.  The right shoulder was then prepped with a DuraPrep from the base of the neck circumferentially to about the mid forearm.  Sterile draping was performed.  A time-out was called.  A marking pen was used to outline the [employer] joint, the coracoid and acromion.  At a point a fingerbreadth posterior medial to the posterior angle acromion a small stab wound was made and the arthroscope easily placed into the shoulder joint.  There was a small hemarthrosis.  Diagnostic arthroscopy revealed considerable small osteocartilaginous loose bodies in the joint anteriorly and actually into the inferior gutter.  The head was easily maneuvered so that I could see the entire gutter and remove the small pieces.  There was a bony Bankart lesion. Part of the Bankart lesion was partially intact, the rest of it represented the loose bodies in the joint.  The lesion extended from about 1 o'clock to about the 5:30 position and did not appear to involve the biceps anchor.  There was no evidence of a rotator cuff tear.  I could see a Hill-Sachs lesion posteriorly.  We then proceeded to debride the capsule anteriorly with attached pieces of loose bone from the glenoid.  At that point, there was no further loose osteocartilaginous material.  I preceded with anterior labral repair.  Using  the Arthrex system, I inserted two 2.9 mm PushLock anchors and one 3.5 mm anchor in the anterior capsule imbricating capsule and ligament and debriding the bony edge of the glenoid to obtain good purchase and adherence of the labrum and ligament complex.  Each of the anchors were firmly secured and tight so I had a very nice repair.  The joint was then explored with evidence of a loose material, and I irrigated the joint one more time.  The posterior wound was left open.  The entry wound was closed with 3-0 Monocryl and Steri-Strips.  Sterile bulky dressing was applied followed by a sling.  The patient tolerated the  procedure well without complications.  We planned to perform this as an outpatient with oxycodone for pain.  Plan to see her in the office in 5-6 days.     Claude Manges. Cleophas Dunker, M.D.     PWW/MEDQ  D:  10/01/2013  T:  10/02/2013  Job:  [id number]

## 2013-10-07 ENCOUNTER — Encounter (HOSPITAL_COMMUNITY): Payer: Self-pay | Admitting: Orthopaedic Surgery

## 2013-10-08 ENCOUNTER — Ambulatory Visit: Payer: 59 | Attending: Orthopaedic Surgery

## 2013-10-08 DIAGNOSIS — IMO0001 Reserved for inherently not codable concepts without codable children: Secondary | ICD-10-CM | POA: Insufficient documentation

## 2013-10-08 DIAGNOSIS — M25519 Pain in unspecified shoulder: Secondary | ICD-10-CM | POA: Insufficient documentation

## 2013-10-08 DIAGNOSIS — M25619 Stiffness of unspecified shoulder, not elsewhere classified: Secondary | ICD-10-CM | POA: Insufficient documentation

## 2013-10-14 ENCOUNTER — Ambulatory Visit: Payer: 59 | Attending: Orthopaedic Surgery | Admitting: Rehabilitation

## 2013-10-14 DIAGNOSIS — M25519 Pain in unspecified shoulder: Secondary | ICD-10-CM | POA: Insufficient documentation

## 2013-10-14 DIAGNOSIS — IMO0001 Reserved for inherently not codable concepts without codable children: Secondary | ICD-10-CM | POA: Insufficient documentation

## 2013-10-14 DIAGNOSIS — M25619 Stiffness of unspecified shoulder, not elsewhere classified: Secondary | ICD-10-CM | POA: Insufficient documentation

## 2013-10-17 ENCOUNTER — Ambulatory Visit: Payer: 59

## 2013-10-21 ENCOUNTER — Encounter: Payer: 59 | Admitting: Rehabilitation

## 2013-10-22 ENCOUNTER — Ambulatory Visit: Payer: 59 | Admitting: Rehabilitation

## 2013-10-24 ENCOUNTER — Ambulatory Visit: Payer: 59 | Admitting: Rehabilitation

## 2013-10-28 ENCOUNTER — Ambulatory Visit: Payer: 59 | Admitting: Rehabilitation

## 2013-10-31 ENCOUNTER — Ambulatory Visit: Payer: 59 | Admitting: Rehabilitation

## 2013-11-04 ENCOUNTER — Ambulatory Visit: Payer: 59

## 2013-11-06 ENCOUNTER — Ambulatory Visit: Payer: 59 | Admitting: Rehabilitation

## 2013-11-11 ENCOUNTER — Ambulatory Visit: Payer: 59 | Attending: Orthopaedic Surgery | Admitting: Rehabilitation

## 2013-11-11 DIAGNOSIS — IMO0001 Reserved for inherently not codable concepts without codable children: Secondary | ICD-10-CM | POA: Insufficient documentation

## 2013-11-11 DIAGNOSIS — M25619 Stiffness of unspecified shoulder, not elsewhere classified: Secondary | ICD-10-CM | POA: Insufficient documentation

## 2013-11-11 DIAGNOSIS — M25519 Pain in unspecified shoulder: Secondary | ICD-10-CM | POA: Insufficient documentation

## 2013-11-13 ENCOUNTER — Ambulatory Visit: Payer: 59 | Admitting: Rehabilitation

## 2013-11-20 ENCOUNTER — Ambulatory Visit: Payer: 59 | Admitting: Rehabilitation

## 2013-11-22 ENCOUNTER — Ambulatory Visit: Payer: 59

## 2013-11-27 ENCOUNTER — Ambulatory Visit: Payer: 59 | Admitting: Rehabilitation

## 2013-12-02 ENCOUNTER — Ambulatory Visit: Payer: 59 | Admitting: Rehabilitation

## 2013-12-09 ENCOUNTER — Ambulatory Visit: Payer: 59 | Admitting: Rehabilitation

## 2015-08-26 ENCOUNTER — Other Ambulatory Visit: Payer: Self-pay | Admitting: Gastroenterology

## 2015-08-26 ENCOUNTER — Encounter (HOSPITAL_COMMUNITY): Payer: Self-pay | Admitting: *Deleted

## 2015-09-07 ENCOUNTER — Encounter (HOSPITAL_COMMUNITY)
Admission: RE | Disposition: A | Discharge status: Home / Self Care | Payer: Self-pay | Source: Ambulatory Visit | Attending: Gastroenterology

## 2015-09-07 ENCOUNTER — Ambulatory Visit (HOSPITAL_COMMUNITY): Payer: 59 | Admitting: Anesthesiology

## 2015-09-07 ENCOUNTER — Encounter (HOSPITAL_COMMUNITY): Payer: Self-pay | Admitting: *Deleted

## 2015-09-07 ENCOUNTER — Ambulatory Visit (HOSPITAL_COMMUNITY)
Admission: RE | Admit: 2015-09-07 | Discharge: 2015-09-07 | Disposition: A | Discharge status: Home / Self Care | Payer: 59 | Source: Ambulatory Visit | Attending: Gastroenterology | Admitting: Gastroenterology

## 2015-09-07 DIAGNOSIS — Z1211 Encounter for screening for malignant neoplasm of colon: Secondary | ICD-10-CM | POA: Diagnosis present

## 2015-09-07 HISTORY — PX: COLONOSCOPY WITH PROPOFOL: SHX5780

## 2015-09-07 SURGERY — COLONOSCOPY WITH PROPOFOL
Anesthesia: Monitor Anesthesia Care

## 2015-09-07 MED ORDER — PROMETHAZINE HCL 25 MG/ML IJ SOLN: 6.2500 mg | INTRAMUSCULAR | Status: DC | PRN

## 2015-09-07 MED ORDER — PROPOFOL 10 MG/ML IV BOLUS
INTRAVENOUS | Status: AC
  Filled 2015-09-07: qty 20

## 2015-09-07 MED ORDER — PROPOFOL 10 MG/ML IV BOLUS
INTRAVENOUS | Status: DC | PRN
  Administered 2015-09-07: 20 mg via INTRAVENOUS
  Administered 2015-09-07: 50 mg via INTRAVENOUS
  Administered 2015-09-07 (×9): 20 mg via INTRAVENOUS
  Administered 2015-09-07: 30 mg via INTRAVENOUS

## 2015-09-07 MED ORDER — SODIUM CHLORIDE 0.9 % IV SOLN: INTRAVENOUS | Status: DC

## 2015-09-07 MED ORDER — LACTATED RINGERS IV SOLN
INTRAVENOUS | Status: DC
  Administered 2015-09-07: 1000 mL via INTRAVENOUS

## 2015-09-07 SURGICAL SUPPLY — 22 items

## 2015-09-07 NOTE — Op Note (Signed)
Procedure: Screening colonoscopy. 02/02/2005 normal baseline screening colonoscopy performed  Endoscopist: Danise Edge  Premedication: Propofol administered by anesthesia  Procedure: The patient was placed in the left lateral decubitus position. Anal inspection and digital rectal exam were normal. The Pentax pediatric colonoscope was introduced into the rectum and advanced to the cecum. A normal-appearing appendiceal orifice was identified. A normal-appearing ileocecal valve was intubated and the terminal ileum inspected. Colonic preparation for the exam today was good. Withdrawal time was 9 minutes  Rectum. Normal. Retroflex view of the distal rectum was normal  Sigmoid colon and descending colon. Normal  Splenic flexure. Normal  Transverse colon. Normal  Hepatic flexure. Normal  Ascending colon. Normal  Cecum and ileocecal valve. Normal  Terminal ileum. Normal  Assessment: Normal screening colonoscopy  Recommendation: Schedule repeat screening colonoscopy in 10 years

## 2015-09-07 NOTE — Discharge Instructions (Signed)
Colonoscopy, Care After °These instructions give you information on caring for yourself after your procedure. Your doctor may also give you more specific instructions. Call your doctor if you have any problems or questions after your procedure. °HOME CARE °· Do not drive for 24 hours. °· Do not sign important papers or use machinery for 24 hours. °· You may shower. °· You may go back to your usual activities, but go slower for the first 24 hours. °· Take rest breaks often during the first 24 hours. °· Walk around or use warm packs on your belly (abdomen) if you have belly cramping or gas. °· Drink enough fluids to keep your pee (urine) clear or pale yellow. °· Resume your normal diet. Avoid heavy or fried foods. °· Avoid drinking alcohol for 24 hours or as told by your doctor. °· Only take medicines as told by your doctor. °If a tissue sample (biopsy) was taken during the procedure:  °· Do not take aspirin or blood thinners for 7 days, or as told by your doctor. °· Do not drink alcohol for 7 days, or as told by your doctor. °· Eat soft foods for the first 24 hours. °GET HELP IF: °You still have a small amount of blood in your poop (stool) 2-3 days after the procedure. °GET HELP RIGHT AWAY IF: °· You have more than a small amount of blood in your poop. °· You see clumps of tissue (blood clots) in your poop. °· Your belly is puffy (swollen). °· You feel sick to your stomach (nauseous) or throw up (vomit). °· You have a fever. °· You have belly pain that gets worse and medicine does not help. °MAKE SURE YOU: °· Understand these instructions. °· Will watch your condition. °· Will get help right away if you are not doing well or get worse. °Document Released: 12/31/2010 Document Revised: 12/03/2013 Document Reviewed: 08/05/2013 °Exit[employer]® Patient Information ©2015 [employer]. This information is not intended to replace advice given to you by your health care provider. Make sure you discuss any questions you have with  your health care provider. ° °

## 2015-09-07 NOTE — Anesthesia Postprocedure Evaluation (Signed)
  Anesthesia Post-op Note  Patient: Shannon Andersen  Procedure(s) Performed: Procedure(s) (LRB): COLONOSCOPY WITH PROPOFOL (N/A)  Patient Location: PACU  Anesthesia Type: MAC  Level of Consciousness: awake and alert   Airway and Oxygen Therapy: Patient Spontanous Breathing  Post-op Pain: mild  Post-op Assessment: Post-op Vital signs reviewed, Patient's Cardiovascular Status Stable, Respiratory Function Stable, Patent Airway and No signs of Nausea or vomiting  Last Vitals:  Filed Vitals:   09/07/15 1228  BP: 140/86  Temp: 36.6 C  Resp: 12    Post-op Vital Signs: stable   Complications: No apparent anesthesia complications

## 2015-09-07 NOTE — H&P (Signed)
  Procedure: Screening colonoscopy. Normal screening colonoscopy performed on 02/02/2005  History: The patient is a 59 year old female born [birth year]. She is scheduled to undergo a repeat screening colonoscopy today.  Medication allergies: Sulfa  Exam: The patient is alert and lying comfortably on the endoscopy stretcher. Abdomen is soft and nontender to palpation. Cardiac exam reveals a regular rhythm. Lungs are clear to auscultation.  Plan: Proceed with screening colonoscopy

## 2015-09-07 NOTE — Transfer of Care (Signed)
Immediate Anesthesia Transfer of Care Note  Patient: Shannon Andersen  Procedure(s) Performed: Procedure(s): COLONOSCOPY WITH PROPOFOL (N/A)  Patient Location: PACU  Anesthesia Type:MAC  Level of Consciousness: sedated  Airway & Oxygen Therapy: Patient Spontanous Breathing and Patient connected to nasal cannula oxygen  Post-op Assessment: Report given to RN and Post -op Vital signs reviewed and stable  Post vital signs: Reviewed and stable  Last Vitals:  Filed Vitals:   09/07/15 1228  BP: 140/86  Temp: 36.6 C  Resp: 12    Complications: No apparent anesthesia complications

## 2015-09-07 NOTE — Anesthesia Preprocedure Evaluation (Signed)
Anesthesia Evaluation  Patient identified by MRN, date of birth, ID band Patient awake    Reviewed: Allergy & Precautions, NPO status , Patient's Chart, lab work & pertinent test results  Airway Mallampati: II  TM Distance: >3 FB Neck ROM: Full    Dental no notable dental hx.    Pulmonary neg pulmonary ROS,    Pulmonary exam normal breath sounds clear to auscultation       Cardiovascular negative cardio ROS Normal cardiovascular exam Rhythm:Regular Rate:Normal     Neuro/Psych negative neurological ROS  negative psych ROS   GI/Hepatic negative GI ROS, Neg liver ROS,   Endo/Other  negative endocrine ROS  Renal/GU negative Renal ROS  negative genitourinary   Musculoskeletal negative musculoskeletal ROS (+)   Abdominal   Peds negative pediatric ROS (+)  Hematology negative hematology ROS (+)   Anesthesia Other Findings   Reproductive/Obstetrics negative OB ROS                             Anesthesia Physical Anesthesia Plan  ASA: I  Anesthesia Plan: MAC   Post-op Pain Management:    Induction: Intravenous  Airway Management Planned: Simple Face Mask  Additional Equipment:   Intra-op Plan:   Post-operative Plan: Extubation in OR  Informed Consent: I have reviewed the patients History and Physical, chart, labs and discussed the procedure including the risks, benefits and alternatives for the proposed anesthesia with the patient or authorized representative who has indicated his/her understanding and acceptance.     Plan Discussed with: CRNA and Surgeon  Anesthesia Plan Comments:         Anesthesia Quick Evaluation

## 2015-09-08 ENCOUNTER — Encounter (HOSPITAL_COMMUNITY): Payer: Self-pay | Admitting: Gastroenterology

## 2016-02-09 DIAGNOSIS — E784 Other hyperlipidemia: Secondary | ICD-10-CM | POA: Diagnosis not present

## 2016-02-23 DIAGNOSIS — E784 Other hyperlipidemia: Secondary | ICD-10-CM | POA: Diagnosis not present

## 2016-02-23 DIAGNOSIS — N951 Menopausal and female climacteric states: Secondary | ICD-10-CM | POA: Diagnosis not present

## 2016-02-23 DIAGNOSIS — Z6826 Body mass index (BMI) 26.0-26.9, adult: Secondary | ICD-10-CM | POA: Diagnosis not present

## 2016-02-23 DIAGNOSIS — Z1389 Encounter for screening for other disorder: Secondary | ICD-10-CM | POA: Diagnosis not present

## 2016-02-23 DIAGNOSIS — M2550 Pain in unspecified joint: Secondary | ICD-10-CM | POA: Diagnosis not present

## 2016-02-23 MED FILL — VASCEPA 1 GM CAPSULE: 1 | 30 days supply | Qty: 120 | Fill #0

## 2016-02-23 MED FILL — VENLAFAXINE HCL ER 150 MG C: 150 | 90 days supply | Qty: 90 | Fill #2

## 2016-02-23 MED FILL — MELOXICAM 15 MG TABLET: 15 | 90 days supply | Qty: 90 | Fill #1

## 2016-02-23 MED FILL — VIT D2 1.25 MG (50,000 UNIT: 1.25 MG | 84 days supply | Qty: 36 | Fill #1

## 2016-05-18 MED FILL — VENLAFAXINE HCL ER 150 MG C: 150 | 90 days supply | Qty: 90 | Fill #3

## 2016-05-18 MED FILL — VIT D2 1.25 MG (50,000 UNIT: 1.25 MG | 84 days supply | Qty: 36 | Fill #2

## 2016-05-18 MED FILL — MELOXICAM 15 MG TABLET: 15 | 90 days supply | Qty: 90 | Fill #2

## 2016-06-13 DIAGNOSIS — Z01419 Encounter for gynecological examination (general) (routine) without abnormal findings: Secondary | ICD-10-CM | POA: Diagnosis not present

## 2016-06-13 DIAGNOSIS — Z6825 Body mass index (BMI) 25.0-25.9, adult: Secondary | ICD-10-CM | POA: Diagnosis not present

## 2016-06-21 DIAGNOSIS — Z13820 Encounter for screening for osteoporosis: Secondary | ICD-10-CM | POA: Diagnosis not present

## 2016-07-05 DIAGNOSIS — E785 Hyperlipidemia, unspecified: Secondary | ICD-10-CM | POA: Diagnosis not present

## 2016-07-06 DIAGNOSIS — M2550 Pain in unspecified joint: Secondary | ICD-10-CM | POA: Diagnosis not present

## 2016-07-06 DIAGNOSIS — E784 Other hyperlipidemia: Secondary | ICD-10-CM | POA: Diagnosis not present

## 2016-07-11 DIAGNOSIS — Z1231 Encounter for screening mammogram for malignant neoplasm of breast: Secondary | ICD-10-CM | POA: Diagnosis not present

## 2016-08-08 DIAGNOSIS — H5203 Hypermetropia, bilateral: Secondary | ICD-10-CM | POA: Diagnosis not present

## 2016-08-08 DIAGNOSIS — H53021 Refractive amblyopia, right eye: Secondary | ICD-10-CM | POA: Diagnosis not present

## 2016-08-08 DIAGNOSIS — H2513 Age-related nuclear cataract, bilateral: Secondary | ICD-10-CM | POA: Diagnosis not present

## 2016-08-17 MED FILL — MELOXICAM 15 MG TABLET: 15 | 90 days supply | Qty: 90 | Fill #3

## 2016-08-17 MED FILL — VENLAFAXINE HCL ER 150 MG C: 150 | 90 days supply | Qty: 90 | Fill #0

## 2016-08-17 MED FILL — VIT D2 1.25 MG (50,000 UNIT: 1.25 MG | 84 days supply | Qty: 36 | Fill #3

## 2016-10-11 DIAGNOSIS — Z0000 Encounter for general adult medical examination without abnormal findings: Secondary | ICD-10-CM | POA: Diagnosis not present

## 2016-10-11 DIAGNOSIS — M859 Disorder of bone density and structure, unspecified: Secondary | ICD-10-CM | POA: Diagnosis not present

## 2016-10-18 DIAGNOSIS — J302 Other seasonal allergic rhinitis: Secondary | ICD-10-CM | POA: Diagnosis not present

## 2016-10-18 DIAGNOSIS — Z0000 Encounter for general adult medical examination without abnormal findings: Secondary | ICD-10-CM | POA: Diagnosis not present

## 2016-10-18 DIAGNOSIS — Z6824 Body mass index (BMI) 24.0-24.9, adult: Secondary | ICD-10-CM | POA: Diagnosis not present

## 2016-10-18 DIAGNOSIS — N951 Menopausal and female climacteric states: Secondary | ICD-10-CM | POA: Diagnosis not present

## 2016-10-18 DIAGNOSIS — M8580 Other specified disorders of bone density and structure, unspecified site: Secondary | ICD-10-CM | POA: Diagnosis not present

## 2016-10-18 DIAGNOSIS — L709 Acne, unspecified: Secondary | ICD-10-CM | POA: Diagnosis not present

## 2016-10-18 DIAGNOSIS — M2550 Pain in unspecified joint: Secondary | ICD-10-CM | POA: Diagnosis not present

## 2016-10-18 DIAGNOSIS — E784 Other hyperlipidemia: Secondary | ICD-10-CM | POA: Diagnosis not present

## 2016-10-20 DIAGNOSIS — Z1212 Encounter for screening for malignant neoplasm of rectum: Secondary | ICD-10-CM | POA: Diagnosis not present

## 2016-11-21 MED FILL — MELOXICAM 15 MG TABLET: 15 | 90 days supply | Qty: 90 | Fill #0

## 2016-11-21 MED FILL — VIT D2 1.25 MG (50,000 UNIT: 1.25 MG | 84 days supply | Qty: 36 | Fill #0

## 2016-11-21 MED FILL — VENLAFAXINE HCL ER 150 MG C: 150 | 90 days supply | Qty: 90 | Fill #1

## 2016-11-29 MED FILL — OMEGA-3 ETHYL ESTERS 1 GM C: 1 | 30 days supply | Qty: 120 | Fill #0

## 2017-02-15 MED FILL — OMEGA-3 ETHYL ESTERS 1 GM C: 1 | 30 days supply | Qty: 120 | Fill #1

## 2017-02-15 MED FILL — VIT D2 1.25 MG (50,000 UNIT: 1.25 MG | 84 days supply | Qty: 36 | Fill #1

## 2017-02-15 MED FILL — VENLAFAXINE HCL ER 150 MG C: 150 | 90 days supply | Qty: 90 | Fill #2

## 2017-05-15 MED FILL — OMEGA-3 ETHYL ESTERS 1 GM C: 1 | 30 days supply | Qty: 120 | Fill #2

## 2017-05-15 MED FILL — VENLAFAXINE HCL ER 150 MG C: 150 | 90 days supply | Qty: 90 | Fill #3

## 2017-05-15 MED FILL — VIT D2 1.25 MG (50,000 UNIT: 1.25 MG | 84 days supply | Qty: 36 | Fill #2

## 2017-05-15 MED FILL — MELOXICAM 15 MG TABLET: 15 | 90 days supply | Qty: 90 | Fill #1

## 2017-06-15 DIAGNOSIS — Z01419 Encounter for gynecological examination (general) (routine) without abnormal findings: Secondary | ICD-10-CM | POA: Diagnosis not present

## 2017-06-15 DIAGNOSIS — Z6826 Body mass index (BMI) 26.0-26.9, adult: Secondary | ICD-10-CM | POA: Diagnosis not present

## 2017-07-25 DIAGNOSIS — Z1231 Encounter for screening mammogram for malignant neoplasm of breast: Secondary | ICD-10-CM | POA: Diagnosis not present

## 2017-08-10 MED FILL — VENLAFAXINE HCL ER 150 MG C: 150 | 90 days supply | Qty: 90 | Fill #0

## 2017-08-10 MED FILL — MELOXICAM 15 MG TABLET: 15 | 90 days supply | Qty: 90 | Fill #2

## 2017-08-10 MED FILL — VIT D2 1.25 MG (50,000 UNIT: 1.25 MG | 84 days supply | Qty: 36 | Fill #3

## 2017-08-10 MED FILL — OMEGA-3 ETHYL ESTER 1 GM CA: 1 | 30 days supply | Qty: 120 | Fill #3

## 2017-09-19 MED FILL — OMEGA-3 ETHYL ESTERS 1 GM C: 1 | 30 days supply | Qty: 120 | Fill #4

## 2017-11-01 MED FILL — MELOXICAM 15 MG TABLET: 15 | 90 days supply | Qty: 90 | Fill #3

## 2017-11-01 MED FILL — VIT D2 1.25 MG (50,000 UNIT: 1.25 MG | 84 days supply | Qty: 36 | Fill #0

## 2017-11-06 DIAGNOSIS — R82998 Other abnormal findings in urine: Secondary | ICD-10-CM | POA: Diagnosis not present

## 2017-11-06 DIAGNOSIS — M859 Disorder of bone density and structure, unspecified: Secondary | ICD-10-CM | POA: Diagnosis not present

## 2017-11-06 DIAGNOSIS — Z0000 Encounter for general adult medical examination without abnormal findings: Secondary | ICD-10-CM | POA: Diagnosis not present

## 2017-11-13 DIAGNOSIS — Z0000 Encounter for general adult medical examination without abnormal findings: Secondary | ICD-10-CM | POA: Diagnosis not present

## 2017-11-13 DIAGNOSIS — M2550 Pain in unspecified joint: Secondary | ICD-10-CM | POA: Diagnosis not present

## 2017-11-13 DIAGNOSIS — E7849 Other hyperlipidemia: Secondary | ICD-10-CM | POA: Diagnosis not present

## 2017-11-13 DIAGNOSIS — Z6826 Body mass index (BMI) 26.0-26.9, adult: Secondary | ICD-10-CM | POA: Diagnosis not present

## 2017-11-13 DIAGNOSIS — Z1389 Encounter for screening for other disorder: Secondary | ICD-10-CM | POA: Diagnosis not present

## 2017-11-13 DIAGNOSIS — J302 Other seasonal allergic rhinitis: Secondary | ICD-10-CM | POA: Diagnosis not present

## 2017-11-13 DIAGNOSIS — L708 Other acne: Secondary | ICD-10-CM | POA: Diagnosis not present

## 2017-11-13 DIAGNOSIS — M859 Disorder of bone density and structure, unspecified: Secondary | ICD-10-CM | POA: Diagnosis not present

## 2017-11-13 MED FILL — ROSUVASTATIN CALCIUM 5 MG T: 5 | 30 days supply | Qty: 30 | Fill #0

## 2017-11-15 MED FILL — VENLAFAXINE HCL ER 150 MG C: 150 | 90 days supply | Qty: 90 | Fill #1

## 2017-11-17 DIAGNOSIS — Z1212 Encounter for screening for malignant neoplasm of rectum: Secondary | ICD-10-CM | POA: Diagnosis not present

## 2017-11-27 MED FILL — OMEGA-3 ETHYL ESTERS 1 GM C: 1 | 30 days supply | Qty: 120 | Fill #5

## 2017-12-14 MED FILL — ROSUVASTATIN CALCIUM 5 MG T: 5 | 30 days supply | Qty: 30 | Fill #1

## 2018-01-23 MED FILL — VIT D2 1.25 MG (50,000 UNIT: 1.25 MG | 84 days supply | Qty: 36 | Fill #1

## 2018-01-24 MED FILL — ROSUVASTATIN CALCIUM 5 MG T: 5 | 30 days supply | Qty: 30 | Fill #2

## 2018-02-01 MED FILL — OMEGA-3 ETHYL ESTERS 1 GM C: 1 | 30 days supply | Qty: 120 | Fill #0

## 2018-02-01 MED FILL — MELOXICAM 15 MG TABLET: 15 | 90 days supply | Qty: 90 | Fill #0

## 2018-02-24 MED FILL — ROSUVASTATIN CALCIUM 5 MG T: 5 | 30 days supply | Qty: 30 | Fill #3

## 2018-02-26 MED FILL — VENLAFAXINE HCL ER 150 MG C: 150 | 90 days supply | Qty: 90 | Fill #2

## 2018-04-02 MED FILL — ROSUVASTATIN CALCIUM 5 MG T: 5 | 30 days supply | Qty: 30 | Fill #4

## 2018-05-01 MED FILL — OMEGA-3 ETHYL ESTERS 1 GM C: 1 | 30 days supply | Qty: 120 | Fill #1

## 2018-05-01 MED FILL — VIT D2 1.25 MG (50,000 UNIT: 1.25 MG | 84 days supply | Qty: 36 | Fill #2

## 2018-05-01 MED FILL — ROSUVASTATIN CALCIUM 5 MG T: 5 | 30 days supply | Qty: 30 | Fill #5

## 2018-05-31 MED FILL — ROSUVASTATIN CALCIUM 5 MG T: 5 | 30 days supply | Qty: 30 | Fill #6

## 2018-05-31 MED FILL — VENLAFAXINE HCL ER 150 MG C: 150 | 90 days supply | Qty: 90 | Fill #3

## 2018-06-18 MED FILL — MELOXICAM 15 MG TABLET: 15 | 90 days supply | Qty: 90 | Fill #1

## 2018-07-09 MED FILL — OMEGA-3 ETHYL ESTERS 1 GM C: 1 | 30 days supply | Qty: 120 | Fill #2

## 2018-07-09 MED FILL — ROSUVASTATIN CALCIUM 5 MG T: 5 | 30 days supply | Qty: 30 | Fill #7

## 2018-07-10 DIAGNOSIS — Z01419 Encounter for gynecological examination (general) (routine) without abnormal findings: Secondary | ICD-10-CM | POA: Diagnosis not present

## 2018-07-10 DIAGNOSIS — Z6825 Body mass index (BMI) 25.0-25.9, adult: Secondary | ICD-10-CM | POA: Diagnosis not present

## 2018-07-23 MED FILL — VIT D2 1.25 MG (50,000 UNIT: 1.25 MG | 84 days supply | Qty: 36 | Fill #3

## 2018-07-28 DIAGNOSIS — Z1231 Encounter for screening mammogram for malignant neoplasm of breast: Secondary | ICD-10-CM | POA: Diagnosis not present

## 2018-08-08 MED FILL — ROSUVASTATIN CALCIUM 5 MG T: 5 | 30 days supply | Qty: 30 | Fill #8

## 2018-08-27 MED FILL — OMEGA-3 ETHYL ESTERS 1 GM C: 1 | 30 days supply | Qty: 120 | Fill #3

## 2018-08-28 MED FILL — VENLAFAXINE HCL ER 150 MG C: 150 | 90 days supply | Qty: 90 | Fill #0

## 2018-09-06 MED FILL — ROSUVASTATIN CALCIUM 5 MG T: 5 | 30 days supply | Qty: 30 | Fill #9

## 2018-09-23 MED FILL — MELOXICAM 15 MG TABLET: 15 | 90 days supply | Qty: 90 | Fill #2

## 2018-10-12 MED FILL — ROSUVASTATIN CALCIUM 5 MG T: 5 | 30 days supply | Qty: 30 | Fill #10

## 2018-10-12 MED FILL — VIT D2 1.25 MG (50,000 UNIT: 1.25 MG | 84 days supply | Qty: 36 | Fill #0

## 2018-10-18 DIAGNOSIS — E7849 Other hyperlipidemia: Secondary | ICD-10-CM | POA: Diagnosis not present

## 2018-10-30 MED FILL — OMEGA-3 ETHYL ESTERS 1 GM C: 1 | 30 days supply | Qty: 120 | Fill #4

## 2018-11-14 MED FILL — ROSUVASTATIN CALCIUM 5 MG T: 5 | 90 days supply | Qty: 90 | Fill #0

## 2018-11-21 DIAGNOSIS — H2513 Age-related nuclear cataract, bilateral: Secondary | ICD-10-CM | POA: Diagnosis not present

## 2018-11-21 DIAGNOSIS — H53021 Refractive amblyopia, right eye: Secondary | ICD-10-CM | POA: Diagnosis not present

## 2018-11-28 DIAGNOSIS — R82998 Other abnormal findings in urine: Secondary | ICD-10-CM | POA: Diagnosis not present

## 2018-11-28 DIAGNOSIS — Z0000 Encounter for general adult medical examination without abnormal findings: Secondary | ICD-10-CM | POA: Diagnosis not present

## 2018-11-28 DIAGNOSIS — M859 Disorder of bone density and structure, unspecified: Secondary | ICD-10-CM | POA: Diagnosis not present

## 2018-12-01 MED FILL — VENLAFAXINE HCL ER 150 MG C: 150 | 90 days supply | Qty: 90 | Fill #1

## 2018-12-10 DIAGNOSIS — E7849 Other hyperlipidemia: Secondary | ICD-10-CM | POA: Diagnosis not present

## 2018-12-10 DIAGNOSIS — M859 Disorder of bone density and structure, unspecified: Secondary | ICD-10-CM | POA: Diagnosis not present

## 2018-12-10 DIAGNOSIS — Z6825 Body mass index (BMI) 25.0-25.9, adult: Secondary | ICD-10-CM | POA: Diagnosis not present

## 2018-12-10 DIAGNOSIS — M25562 Pain in left knee: Secondary | ICD-10-CM | POA: Diagnosis not present

## 2018-12-10 DIAGNOSIS — Z0000 Encounter for general adult medical examination without abnormal findings: Secondary | ICD-10-CM | POA: Diagnosis not present

## 2018-12-10 DIAGNOSIS — J302 Other seasonal allergic rhinitis: Secondary | ICD-10-CM | POA: Diagnosis not present

## 2018-12-10 DIAGNOSIS — Z1389 Encounter for screening for other disorder: Secondary | ICD-10-CM | POA: Diagnosis not present

## 2018-12-13 MED FILL — MELOXICAM 15 MG TABLET: 15 | 90 days supply | Qty: 90 | Fill #0

## 2018-12-20 DIAGNOSIS — Z1212 Encounter for screening for malignant neoplasm of rectum: Secondary | ICD-10-CM | POA: Diagnosis not present

## 2018-12-25 MED FILL — OMEGA-3 ETHYL ESTERS 1 GM C: 1 | 30 days supply | Qty: 120 | Fill #5

## 2019-01-08 MED FILL — VIT D2 1.25 MG (50,000 UNIT: 1.25 MG | 84 days supply | Qty: 36 | Fill #1

## 2019-02-11 MED FILL — OMEGA-3 ETHYL ESTERS 1 GM C: 1 | 30 days supply | Qty: 120 | Fill #0

## 2019-02-11 MED FILL — ROSUVASTATIN CALCIUM 5 MG T: 5 | 90 days supply | Qty: 90 | Fill #1

## 2019-02-27 MED FILL — VENLAFAXINE HCL ER 150 MG C: 150 | 90 days supply | Qty: 90 | Fill #2

## 2019-03-20 MED FILL — VIT D2 1.25 MG (50,000 UNIT: 1.25 MG | 84 days supply | Qty: 36 | Fill #0

## 2019-03-20 MED FILL — MELOXICAM 15 MG TABLET: 15 | 90 days supply | Qty: 90 | Fill #1

## 2019-04-01 MED FILL — OMEGA-3 ETHYL ESTERS 1 GM C: 1 | 30 days supply | Qty: 120 | Fill #1

## 2019-05-11 MED FILL — ROSUVASTATIN CALCIUM 5 MG T: 5 | 90 days supply | Qty: 90 | Fill #2

## 2019-05-13 DIAGNOSIS — M2550 Pain in unspecified joint: Secondary | ICD-10-CM | POA: Diagnosis not present

## 2019-05-13 DIAGNOSIS — M25562 Pain in left knee: Secondary | ICD-10-CM | POA: Diagnosis not present

## 2019-05-13 MED FILL — OMEGA-3 ETHYL ESTERS 1 GM C: 1 | 30 days supply | Qty: 120 | Fill #2

## 2019-05-15 ENCOUNTER — Other Ambulatory Visit (HOSPITAL_COMMUNITY): Payer: Self-pay | Admitting: Internal Medicine

## 2019-05-15 ENCOUNTER — Other Ambulatory Visit: Payer: Self-pay | Admitting: Internal Medicine

## 2019-05-15 DIAGNOSIS — M25562 Pain in left knee: Secondary | ICD-10-CM

## 2019-05-20 ENCOUNTER — Ambulatory Visit (HOSPITAL_COMMUNITY)
Admission: RE | Admit: 2019-05-20 | Discharge: 2019-05-20 | Disposition: A | Discharge status: Home / Self Care | Payer: 59 | Source: Ambulatory Visit | Attending: Internal Medicine | Admitting: Internal Medicine

## 2019-05-20 DIAGNOSIS — M25562 Pain in left knee: Secondary | ICD-10-CM | POA: Insufficient documentation

## 2019-05-20 DIAGNOSIS — M2410 Other articular cartilage disorders, unspecified site: Secondary | ICD-10-CM | POA: Diagnosis not present

## 2019-05-23 ENCOUNTER — Other Ambulatory Visit: Payer: Self-pay

## 2019-05-23 ENCOUNTER — Ambulatory Visit: Payer: 59 | Admitting: Orthopaedic Surgery

## 2019-05-23 ENCOUNTER — Encounter: Payer: Self-pay | Admitting: Orthopaedic Surgery

## 2019-05-23 ENCOUNTER — Ambulatory Visit (INDEPENDENT_AMBULATORY_CARE_PROVIDER_SITE_OTHER): Payer: 59

## 2019-05-23 VITALS — BP 129/75 | HR 76 | Ht 70.0 in | Wt 180.0 lb

## 2019-05-23 DIAGNOSIS — M25562 Pain in left knee: Secondary | ICD-10-CM

## 2019-05-23 DIAGNOSIS — G8929 Other chronic pain: Secondary | ICD-10-CM

## 2019-05-23 NOTE — Progress Notes (Signed)
Office Visit Note   Patient: Shannon Andersen           Date of Birth: [birth year]           MRN: [id number] Visit Date: 05/23/2019              Requested by: Shannon Andersen [address] [place],  [place] [id number] PCP: Shannon Andersen   Assessment & Plan: Visit Diagnoses:  1. Chronic pain of left knee     Plan: Shannon Andersen had an acute exacerbation of chronic left knee pain within the last several months.  Her primary care physician ordered that demonstrated a radial tear of the posterior horn of the medial meniscus with a flipped fragment.  Lateral meniscus was intact.  Are also full-thickness cartilage loss changes of the medial joint.  No lateral compartment.  Or patellofemoral compartment degenerative changes.  Long discussion regarding x-ray findings and MRI scan findings.  Choices would include cortisone injection and exercises with over-the-counter anti-inflammatory medicines.  Knee arthroscopy for debridement of the torn meniscus and a chondroplasty which she would like to pursue.  She is fully aware that this will not resolve the entire problem as she will still have some residual arthritis.  Will be feels like this recent exacerbation might be related to the torn cartilage.  We will schedule the surgery.  Discussed the incisions, pain time out of work and potential complications  Follow-Up Instructions: Return We will schedule left knee arthroscopy.   Orders:  Orders Placed This Encounter  Procedures  . XR KNEE 3 VIEW LEFT   No orders of the defined types were placed in this encounter.     Procedures: No procedures performed   Clinical Data: No additional findings.   Subjective: Chief Complaint  Patient presents with  . Left Knee - Pain  Patient presents today for left knee pain. She has been having pain in her left knee for months. She said that the pain has progressed in the last two weeks.The pain is located medially, along with some mild swelling.  The pain is better with rest and ice. She is not taking anything for pain. She saw her PCP and had an MRI done a few days ago.   HPI  Review of Systems  Constitutional: Negative for fatigue.  HENT: Negative for ear pain.   Eyes: Negative for pain.  Respiratory: Negative for shortness of breath.   Gastrointestinal: Negative for constipation and diarrhea.  Endocrine: Negative for cold intolerance and heat intolerance.  Genitourinary: Negative for difficulty urinating.  Musculoskeletal: Positive for joint swelling.  Skin: Negative for rash.  Allergic/Immunologic: Negative for food allergies.  Neurological: Negative for weakness.  Hematological: Does not bruise/bleed easily.  Psychiatric/Behavioral: Negative for sleep disturbance.     Objective: Vital Signs: BP 129/75   Pulse 76   Ht 5\' 10"  (1.778 m)   Wt 180 lb (81.6 kg)   BMI 25.83 kg/m   Physical Exam Constitutional:      Appearance: She is well-developed.  Eyes:     Pupils: Pupils are equal, round, and reactive to light.  Pulmonary:     Effort: Pulmonary effort is normal.  Skin:    General: Skin is warm and dry.  Neurological:     Mental Status: She is alert and oriented to person, place, and time.  Psychiatric:        Behavior: Behavior normal.     Ortho Exam left knee with small effusion.  Predominately medial  joint pain that was relatively mild to moderate.  Did not localized anterior or posterior.  No lateral joint pain.  No patella pain.  Lacks just a few degrees to full extension and flexed over 105 degrees without instability.  No popliteal pain or mass.  No calf pain.  Specialty Comments:  No specialty comments available.  Imaging: Xr Knee 3 View Left  Result Date: 05/23/2019 Films of the left knee were obtained in 3 projections standing.  There is significant decrease in the medial joint space associated with subchondral sclerosis, subchondral cysts and peripheral osteophytes.  Mild degenerative changes  of the lateral compartment and the patellofemoral joint.  No ectopic calcification.  No fractures    PMFS History: Patient Active Problem List   Diagnosis Date Noted  . Chronic pain of left knee 05/23/2019  . Dislocation, shoulder, anterior 10/01/2013  . 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 Complication of anesthesia   . Osteopenia   . Osteoporosis    STIFFNESS IN HANDS  . PONV (postoperative nausea and vomiting)     History reviewed. No pertinent family history.  Past Surgical History:  Procedure Laterality Date  . BANKART REPAIR Right 10/01/2013   Procedure: Right Shoulder Arthroscopic Debridement with Labral Repair;  Surgeon: Garald Balding, Andersen;  Location: [facility];  Service: Orthopedics;  Laterality: Right;  . BREAST SURGERY Left    BIOPSY-BENIGN  . COLONOSCOPY WITH PROPOFOL N/A 09/07/2015   Procedure: COLONOSCOPY WITH PROPOFOL;  Surgeon: Garlan Fair, Andersen;  Location: [facility];  Service: Endoscopy;  Laterality: N/A;  . TONSILLECTOMY     Social History   Occupational History  . Not on file  Tobacco Use  . Smoking status: Never Smoker  . Smokeless tobacco: Never Used  Substance and Sexual Activity  . Alcohol use: Not on file    Comment: rarely  . Drug use: No  . Sexual activity: Not on file

## 2019-05-29 MED FILL — VENLAFAXINE HCL ER 150 MG C: 150 | 90 days supply | Qty: 90 | Fill #0

## 2019-05-30 ENCOUNTER — Telehealth: Payer: Self-pay | Admitting: Orthopaedic Surgery

## 2019-05-30 NOTE — Telephone Encounter (Signed)
FYI

## 2019-05-30 NOTE — Telephone Encounter (Signed)
Please check with Debbie-not clear what the message implies

## 2019-05-30 NOTE — Telephone Encounter (Signed)
Left message on voice mail providing patient with name and direct number for scheduling surgery at [facility].

## 2019-06-04 NOTE — H&P (Signed)
Shannon Andersen is an 63 y.o. female.    Chief Complaint: painful left knee  HPI: Shannon Andersen is a 63 year old white female who presented for left knee pain for months. She said that the pain has progressed in the last two weeks.The pain is located medially and along with some mild swelling. The pain is better with rest and ice. She is not taking anything for pain. She saw her PCP and had an MRI done.    Revealed a full-thickness cartilage loss in the medial femoral-tibial compartment with subchondral reactive marrow edema and marginal osteophytes with radial tear of the posterior horn of the medial meniscus at its root with a fragment flipped towards the intercondylar notch and peripheral meniscal extrusion extrusion   Past Medical History:  Diagnosis Date  . Complication of anesthesia   . Osteopenia   . Osteoporosis    STIFFNESS IN HANDS  . PONV (postoperative nausea and vomiting)     Past Surgical History:  Procedure Laterality Date  . BANKART REPAIR Right 10/01/2013   Procedure: Right Shoulder Arthroscopic Debridement with Labral Repair;  Surgeon: Garald Balding, MD;  Location: [facility];  Service: Orthopedics;  Laterality: Right;  . BREAST SURGERY Left    BIOPSY-BENIGN  . COLONOSCOPY WITH PROPOFOL N/A 09/07/2015   Procedure: COLONOSCOPY WITH PROPOFOL;  Surgeon: Garlan Fair, MD;  Location: [facility];  Service: Endoscopy;  Laterality: N/A;  . TONSILLECTOMY      Social History:  Social History   Socioeconomic History  . Marital status: Married    Spouse name: Not on file  . Number of children: Not on file  . Years of education: Not on file  . Highest education level: Not on file  Occupational History  . Not on file  Social Needs  . Financial resource strain: Not on file  . Food insecurity    Worry: Not on file    Inability: Not on file  . Transportation needs    Medical: Not on file    Non-medical: Not on file  Tobacco Use  . Smoking status: Never Smoker  . Smokeless  tobacco: Never Used  Substance and Sexual Activity  . Alcohol use: Not on file    Comment: rarely  . Drug use: No  . Sexual activity: Not on file  Lifestyle  . Physical activity    Days per week: Not on file    Minutes per session: Not on file  . Stress: Not on file  Relationships  . Social Herbalist on phone: Not on file    Gets together: Not on file    Attends religious service: Not on file    Active member of club or organization: Not on file    Attends meetings of clubs or organizations: Not on file    Relationship status: Not on file  . Intimate partner violence    Fear of current or ex partner: Not on file    Emotionally abused: Not on file    Physically abused: Not on file    Forced sexual activity: Not on file  Other Topics Concern  . Not on file  Social History Narrative  . Not on file   Allergies:  Allergies  Allergen Reactions  . Sulfa Antibiotics     Chills, fever, almost like a sunburn   . Vascepa [Icosapent Ethyl]     No medications prior to admission.   No current facility-administered medications for this encounter.    Current Outpatient  Medications  Medication Sig Dispense Refill  . meloxicam (MOBIC) 15 MG tablet Take 15 mg by mouth daily.    . Omega-3 1000 MG CAPS     . rosuvastatin (CRESTOR) 5 MG tablet     . venlafaxine XR (EFFEXOR-XR) 150 MG 24 hr capsule Take 150 mg by mouth daily. Hot flashes  4  . Vitamin D, Ergocalciferol, (DRISDOL) 50000 UNITS CAPS capsule Take 50,000 Units by mouth every Monday, Wednesday, and Friday.        No results found for this or any previous visit (from the past 48 hour(s)). No results found.    There were no vitals taken for this visit. Physical Exam  Constitutional: She is oriented to person, place, and time. She appears well-developed and well-nourished.  HENT:  Head: Normocephalic.  Eyes: Pupils are equal, round, and reactive to light. EOM are normal.  Neck: Neck supple.  Cardiovascular:  Normal rate, regular rhythm and intact distal pulses.  Respiratory: Effort normal and breath sounds normal.  GI: Soft. Bowel sounds are normal.  Neurological: She is alert and oriented to person, place, and time.  Skin: Skin is warm and dry.  Psychiatric: She has a normal mood and affect. Her behavior is normal. Judgment and thought content normal.     MRI: MRI OF THE LEFT KNEE WITHOUT CONTRAST  FINDINGS:  MENISCI Medial meniscus: Radial tear of the posterior horn of the medial meniscus at the root with a fragment flipped towards the intercondylar notch and peripheral meniscal extrusion. Severe degeneration of the root of the anterior horn of the medial meniscus.  Lateral meniscus:  Intact.  LIGAMENTS  Cruciates:  Intact ACL and PCL. Collaterals: Medial collateral ligament is intact. Lateral collateral ligament complex is intact.  CARTILAGE Patellofemoral:  No chondral defect.  Medial: Full-thickness cartilage loss of the medial femorotibial compartment with subchondral reactive marrow edema and marginal osteophytes.  Lateral:  No chondral defect.  Joint: Small joint effusion. Normal Hoffa's fat. No plical thickening.  Popliteal Fossa:  Small Baker's cyst.  Intact popliteus tendon.  Extensor Mechanism: Intact quadriceps tendon. Intact patellar tendon. Intact medial patellar retinaculum. Intact lateral patellar retinaculum. Intact MPFL.  Bones:  No acute osseous abnormality.  No aggressive osseous lesion.    Assessment/Plan Impression:  Full-thickness cartilage loss of the medial femorotibial compartment with subchondral reactive marrow edema and marginal osteophytes.  Plan: Arthroscopic examination of the left knee with debridement and medial meniscectomy   Jacqualine CodeBrian Wyn Nettle, PA-C 06/04/2019, 1:39 PM

## 2019-06-06 ENCOUNTER — Other Ambulatory Visit: Payer: Self-pay

## 2019-06-06 NOTE — Patient Instructions (Addendum)
Shannon BrownRuby W Andersen   Your procedure is scheduled on: Tuesday 06/11/2019    Report to [facility]  Entrance Report to  Short Stay at 05:30  AM                 YOU NEED TO HAVE A COVID 19 TEST ON_6/26______ @_2 :45______, THIS TEST MUST BE DONE BEFORE SURGERY, [facility].              ONCE YOUR COVID TEST IS COMPLETED, PLEASE BEGIN THE QUARANTINE INSTRUCTIONS AS OUTLINED IN YOUR HANDOUT.    Call this number if you have problems the morning of surgery [phone]    .                NO SOLID FOOD AFTER MIDNIGHT THE NIGHT PRIOR TO SURGERY . NOTHING BY MOUTH EXCEPT CLEAR LIQUIDS UNTIL 3 HOURS PRIOR TO SCHEULED SURGERY.              PLEASE FINISH ENSURE DRINK PER SURGEON ORDER 3 HOURS PRIOR TO SCHEDULED SURGERY TIME WHICH NEEDS TO BE COMPLETED AT _04:30 am.   CLEAR LIQUID DIET   Foods Allowed                                                                     Foods Excluded  Coffee and tea, regular and decaf                             liquids that you cannot  Plain Jell-O in any flavor                                             see through such as: Fruit ices (not with fruit pulp)                                     milk, soups, orange juice  Iced Popsicles                                    All solid food Carbonated beverages, regular and diet                                    Cranberry, grape and apple juices Sports drinks like Gatorade Lightly seasoned clear broth or consume(fat free) Sugar, honey syrup               BRUSH YOUR TEETH MORNING OF SURGERY AND RINSE YOUR MOUTH OUT, NO CHEWING GUM CANDY OR MINTS.      Take these medicines the morning of surgery with A SIP OF WATER                                                                                                                        :  Rosuvastatin (Crestor), Venlafaxine (Effexor-XR)                                 You may not have any  metal on your body including hair pins and              piercings             Do not wear jewelry, make-up, lotions, powders or perfumes, deodorant             Do not wear nail polish.             Do not shave  48 hours prior to surgery.              Do not bring valuables to the hospital. [facility] IS NOT             RESPONSIBLE   FOR VALUABLES.  Contacts, dentures or bridgework may not be worn into surgery. .     Patients discharged the day of surgery will not be allowed to drive home.  IF YOU ARE HAVING SURGERY AND GOING HOME THE SAME DAY, YOU MUST HAVE AN ADULT TO DRIVE YOU HOME AND BE WITH YOU FOR 24 HOURS.  YOU MAY GO HOME BY TAXI OR UBER OR ORTHERWISE, BUT AN ADULT MUST ACCOMPANY YOU HOME AND STAY WITH YOU FOR 24 HOURS.   Name and phone number of your driver:                Please read over the following fact sheets you were given: _____________________________________________________________________             [facility] Pre[facility] Before surgery, you can play an important role .  Because skin is not sterile, your skin needs to be as free of germs as possible.   You can reduce the number of germs on your skin by washing with CHG (chlorahexidine gluconate) soap before surgery.   CHG is an antiseptic cleaner which kills germs and bonds with the skin to continue killing germs even after washing. Please DO NOT use if you have an allergy to CHG or antibacterial soaps.   If your skin becomes reddened/irritated stop using the CHG and inform your nurse when you arrive at Short Stay. Do not shave (including legs and underarms) for at least 48 hours prior to the first CHG shower.     Please follow these instructions carefully:  1.  Shower with CHG Soap the night before surgery and the  morning of Surgery.  2.  If you choose to wash your hair, wash your hair first as usual with your  normal  shampoo.  3.  After you shampoo, rinse your hair and body thoroughly to  remove the  shampoo.                                        4.  Use CHG as you would any other liquid soap.  You can apply chg directly  to the skin and wash                       Gently with a scrungie or clean washcloth.  5.  Apply the CHG Soap to your body ONLY FROM THE NECK DOWN.   Do not use on  face/ open                           Wound or open sores. Avoid contact with eyes, ears mouth and genitals (private parts).                       Wash face,  Genitals (private parts) with your normal soap.             6.  Wash thoroughly, paying special attention to the area where your surgery  will be performed.  7.  Thoroughly rinse your body with warm water from the neck down.  8.  DO NOT shower/wash with your normal soap after using and rinsing off  the CHG Soap.             9.  Pat yourself dry with a clean towel.            10.  Wear clean pajamas.            11.  Place clean sheets on your bed the night of your first shower and do not  sleep with pets.  Day of Surgery : Do not apply any lotions/deodorants the morning of surgery.  Please wear clean clothes to the hospital/surgery center.   FAILURE TO FOLLOW THESE INSTRUCTIONS MAY RESULT IN THE CANCELLATION OF YOUR SURGERY PATIENT SIGNATURE_________________________________  NURSE SIGNATURE__________________________________  ________________________________________________________________________   Adam Phenix  An incentive spirometer is a tool that can help keep your lungs clear and active. This tool measures how well you are filling your lungs with each breath. Taking long deep breaths may help reverse or decrease the chance of developing breathing (pulmonary) problems (especially infection) following:  A long period of time when you are unable to move or be active. BEFORE THE PROCEDURE   If the spirometer includes an indicator to show your best effort, your nurse or respiratory therapist will set it to a desired goal.  If  possible, sit up straight or lean slightly forward. Try not to slouch.  Hold the incentive spirometer in an upright position. INSTRUCTIONS FOR USE  1. Sit on the edge of your bed if possible, or sit up as far as you can in bed or on a chair. 2. Hold the incentive spirometer in an upright position. 3. Breathe out normally. 4. Place the mouthpiece in your mouth and seal your lips tightly around it. 5. Breathe in slowly and as deeply as possible, raising the piston or the ball toward the top of the column. 6. Hold your breath for 3-5 seconds or for as long as possible. Allow the piston or ball to fall to the bottom of the column. 7. Remove the mouthpiece from your mouth and breathe out normally. 8. Rest for a few seconds and repeat Steps 1 through 7 at least 10 times every 1-2 hours when you are awake. Take your time and take a few normal breaths between deep breaths. 9. The spirometer may include an indicator to show your best effort. Use the indicator as a goal to work toward during each repetition. 10. After each set of 10 deep breaths, practice coughing to be sure your lungs are clear. If you have an incision (the cut made at the time of surgery), support your incision when coughing by placing a pillow or rolled up towels firmly against it. Once you are able to get out of bed, walk around indoors and  cough well. You may stop using the incentive spirometer when instructed by your caregiver.  RISKS AND COMPLICATIONS  Take your time so you do not get dizzy or light-headed.  If you are in pain, you may need to take or ask for pain medication before doing incentive spirometry. It is harder to take a deep breath if you are having pain. AFTER USE  Rest and breathe slowly and easily.  It can be helpful to keep track of a log of your progress. Your caregiver can provide you with a simple table to help with this. If you are using the spirometer at home, follow these instructions: SEEK MEDICAL CARE  IF:   You are having difficultly using the spirometer.  You have trouble using the spirometer as often as instructed.  Your pain medication is not giving enough relief while using the spirometer.  You develop fever of 100.5 F (38.1 C) or higher. SEEK IMMEDIATE MEDICAL CARE IF:   You cough up bloody sputum that had not been present before.  You develop fever of 102 F (38.9 C) or greater.  You develop worsening pain at or near the incision site. MAKE SURE YOU:   Understand these instructions.  Will watch your condition.  Will get help right away if you are not doing well or get worse. Document Released: 04/10/2007 Document Revised: 02/20/2012 Document Reviewed: 06/11/2007 [facility] Patient Information 2014 [employer] [place].   ________________________________________________________________________

## 2019-06-07 ENCOUNTER — Encounter (HOSPITAL_COMMUNITY)
Admission: RE | Admit: 2019-06-07 | Discharge: 2019-06-07 | Disposition: A | Discharge status: Home / Self Care | Payer: 59 | Source: Ambulatory Visit | Attending: Orthopaedic Surgery | Admitting: Orthopaedic Surgery

## 2019-06-07 ENCOUNTER — Other Ambulatory Visit (HOSPITAL_COMMUNITY)
Admission: RE | Admit: 2019-06-07 | Discharge: 2019-06-07 | Disposition: A | Discharge status: Home / Self Care | Payer: 59 | Source: Ambulatory Visit | Attending: Orthopaedic Surgery | Admitting: Orthopaedic Surgery

## 2019-06-07 ENCOUNTER — Other Ambulatory Visit: Payer: Self-pay

## 2019-06-07 ENCOUNTER — Ambulatory Visit (HOSPITAL_COMMUNITY)
Admission: RE | Admit: 2019-06-07 | Discharge: 2019-06-07 | Disposition: A | Discharge status: Home / Self Care | Payer: 59 | Source: Ambulatory Visit | Attending: Orthopedic Surgery | Admitting: Orthopedic Surgery

## 2019-06-07 ENCOUNTER — Encounter (HOSPITAL_COMMUNITY): Payer: Self-pay

## 2019-06-07 DIAGNOSIS — J9811 Atelectasis: Secondary | ICD-10-CM | POA: Diagnosis not present

## 2019-06-07 DIAGNOSIS — Z01818 Encounter for other preprocedural examination: Secondary | ICD-10-CM

## 2019-06-07 LAB — CBC WITH DIFFERENTIAL/PLATELET
Abs Immature Granulocytes: 0.01 10*3/uL (ref 0.00–0.07)
Basophils Absolute: 0.1 10*3/uL (ref 0.0–0.1)
Basophils Relative: 1 %
Eosinophils Absolute: 0.2 10*3/uL (ref 0.0–0.5)
Eosinophils Relative: 2 %
HCT: 43.2 % (ref 36.0–46.0)
Hemoglobin: 13.7 g/dL (ref 12.0–15.0)
Immature Granulocytes: 0 %
Lymphocytes Relative: 26 %
Lymphs Abs: 2.4 10*3/uL (ref 0.7–4.0)
MCH: 28.4 pg (ref 26.0–34.0)
MCHC: 31.7 g/dL (ref 30.0–36.0)
MCV: 89.6 fL (ref 80.0–100.0)
Monocytes Absolute: 0.8 10*3/uL (ref 0.1–1.0)
Monocytes Relative: 9 %
Neutro Abs: 5.9 10*3/uL (ref 1.7–7.7)
Neutrophils Relative %: 62 %
Platelets: 179 10*3/uL (ref 150–400)
RBC: 4.82 MIL/uL (ref 3.87–5.11)
RDW: 12.9 % (ref 11.5–15.5)
WBC: 9.4 10*3/uL (ref 4.0–10.5)
nRBC: 0 % (ref 0.0–0.2)

## 2019-06-07 LAB — COMPREHENSIVE METABOLIC PANEL
ALT: 36 U/L (ref 0–44)
AST: 28 U/L (ref 15–41)
Albumin: 4.5 g/dL (ref 3.5–5.0)
Alkaline Phosphatase: 83 U/L (ref 38–126)
Anion gap: 12 (ref 5–15)
BUN: 27 mg/dL — ABNORMAL HIGH (ref 8–23)
CO2: 24 mmol/L (ref 22–32)
Calcium: 9.9 mg/dL (ref 8.9–10.3)
Chloride: 105 mmol/L (ref 98–111)
Creatinine, Ser: 0.75 mg/dL (ref 0.44–1.00)
GFR calc Af Amer: >60 mL/min (ref >60)
GFR calc non Af Amer: >60 mL/min (ref >60)
Glucose, Bld: 97 mg/dL (ref 70–99)
Potassium: 4.4 mmol/L (ref 3.5–5.1)
Sodium: 141 mmol/L (ref 135–145)
Total Bilirubin: 0.3 mg/dL (ref 0.3–1.2)
Total Protein: 7.6 g/dL (ref 6.5–8.1)

## 2019-06-07 LAB — URINALYSIS, ROUTINE W REFLEX MICROSCOPIC
Bilirubin Urine: NEGATIVE
Glucose, UA: NEGATIVE mg/dL
Hgb urine dipstick: NEGATIVE
Ketones, ur: NEGATIVE mg/dL
Leukocytes,Ua: NEGATIVE
Nitrite: NEGATIVE
Protein, ur: NEGATIVE mg/dL
Specific Gravity, Urine: 1.021 (ref 1.005–1.030)
pH: 5 (ref 5.0–8.0)

## 2019-06-07 LAB — SARS CORONAVIRUS 2 (TAT 6-24 HRS): SARS Coronavirus 2: NEGATIVE

## 2019-06-07 MED ORDER — ENSURE PRE-SURGERY PO LIQD
296.0000 mL | Freq: Once | ORAL | Status: DC
  Filled 2019-06-07: qty 296

## 2019-06-10 NOTE — Anesthesia Preprocedure Evaluation (Addendum)
Anesthesia Evaluation  Patient identified by MRN, date of birth, ID band Patient awake    Reviewed: Allergy & Precautions, NPO status , Patient's Chart, lab work & pertinent test results  History of Anesthesia Complications (+) PONV and history of anesthetic complications  Airway Mallampati: II  TM Distance: >3 FB Neck ROM: Full    Dental  (+) Teeth Intact, Dental Advisory Given   Pulmonary neg pulmonary ROS,    Pulmonary exam normal breath sounds clear to auscultation       Cardiovascular negative cardio ROS Normal cardiovascular exam Rhythm:Regular Rate:Normal     Neuro/Psych negative neurological ROS     GI/Hepatic negative GI ROS, Neg liver ROS,   Endo/Other  negative endocrine ROS  Renal/GU negative Renal ROS     Musculoskeletal left knee torn medial meniscus   Abdominal   Peds  Hematology negative hematology ROS (+)   Anesthesia Other Findings Day of surgery medications reviewed with the patient.  Reproductive/Obstetrics                            Anesthesia Physical Anesthesia Plan  ASA: I  Anesthesia Plan: General   Post-op Pain Management:    Induction: Intravenous  PONV Risk Score and Plan: 4 or greater and Diphenhydramine, Midazolam, Dexamethasone and Ondansetron  Airway Management Planned: LMA  Additional Equipment:   Intra-op Plan:   Post-operative Plan: Extubation in OR  Informed Consent: I have reviewed the patients History and Physical, chart, labs and discussed the procedure including the risks, benefits and alternatives for the proposed anesthesia with the patient or authorized representative who has indicated his/her understanding and acceptance.     Dental advisory given  Plan Discussed with: CRNA  Anesthesia Plan Comments:        Anesthesia Quick Evaluation

## 2019-06-11 ENCOUNTER — Encounter (HOSPITAL_COMMUNITY)
Admission: RE | Disposition: A | Discharge status: Home / Self Care | Payer: Self-pay | Source: Ambulatory Visit | Attending: Orthopaedic Surgery

## 2019-06-11 ENCOUNTER — Other Ambulatory Visit: Payer: Self-pay

## 2019-06-11 ENCOUNTER — Ambulatory Visit (HOSPITAL_COMMUNITY): Payer: 59 | Admitting: Certified Registered Nurse Anesthetist

## 2019-06-11 ENCOUNTER — Encounter (HOSPITAL_COMMUNITY): Payer: Self-pay | Admitting: Emergency Medicine

## 2019-06-11 ENCOUNTER — Ambulatory Visit (HOSPITAL_COMMUNITY): Payer: 59 | Admitting: Physician Assistant

## 2019-06-11 ENCOUNTER — Telehealth: Payer: Self-pay | Admitting: Radiology

## 2019-06-11 ENCOUNTER — Other Ambulatory Visit: Payer: Self-pay | Admitting: Orthopedic Surgery

## 2019-06-11 ENCOUNTER — Ambulatory Visit (HOSPITAL_COMMUNITY)
Admission: RE | Admit: 2019-06-11 | Discharge: 2019-06-11 | Disposition: A | Discharge status: Home / Self Care | Payer: 59 | Source: Ambulatory Visit | Attending: Orthopaedic Surgery | Admitting: Orthopaedic Surgery

## 2019-06-11 DIAGNOSIS — M659 Synovitis and tenosynovitis, unspecified: Secondary | ICD-10-CM | POA: Diagnosis not present

## 2019-06-11 DIAGNOSIS — M25762 Osteophyte, left knee: Secondary | ICD-10-CM | POA: Insufficient documentation

## 2019-06-11 DIAGNOSIS — Z79899 Other long term (current) drug therapy: Secondary | ICD-10-CM | POA: Diagnosis not present

## 2019-06-11 DIAGNOSIS — S83232A Complex tear of medial meniscus, current injury, left knee, initial encounter: Secondary | ICD-10-CM | POA: Insufficient documentation

## 2019-06-11 DIAGNOSIS — Z791 Long term (current) use of non-steroidal anti-inflammatories (NSAID): Secondary | ICD-10-CM | POA: Insufficient documentation

## 2019-06-11 DIAGNOSIS — S43439A Superior glenoid labrum lesion of unspecified shoulder, initial encounter: Secondary | ICD-10-CM | POA: Diagnosis not present

## 2019-06-11 DIAGNOSIS — X58XXXA Exposure to other specified factors, initial encounter: Secondary | ICD-10-CM | POA: Insufficient documentation

## 2019-06-11 DIAGNOSIS — M25562 Pain in left knee: Secondary | ICD-10-CM | POA: Diagnosis present

## 2019-06-11 DIAGNOSIS — S83242A Other tear of medial meniscus, current injury, left knee, initial encounter: Secondary | ICD-10-CM | POA: Diagnosis not present

## 2019-06-11 DIAGNOSIS — M1712 Unilateral primary osteoarthritis, left knee: Secondary | ICD-10-CM | POA: Diagnosis not present

## 2019-06-11 DIAGNOSIS — M23322 Other meniscus derangements, posterior horn of medial meniscus, left knee: Secondary | ICD-10-CM

## 2019-06-11 DIAGNOSIS — S43006A Unspecified dislocation of unspecified shoulder joint, initial encounter: Secondary | ICD-10-CM | POA: Diagnosis not present

## 2019-06-11 DIAGNOSIS — M2242 Chondromalacia patellae, left knee: Secondary | ICD-10-CM

## 2019-06-11 DIAGNOSIS — M23332 Other meniscus derangements, other medial meniscus, left knee: Secondary | ICD-10-CM | POA: Diagnosis not present

## 2019-06-11 HISTORY — PX: KNEE ARTHROSCOPY WITH MEDIAL MENISECTOMY: SHX5651

## 2019-06-11 SURGERY — ARTHROSCOPY, KNEE, WITH MEDIAL MENISCECTOMY
Anesthesia: General | Laterality: Left

## 2019-06-11 MED ORDER — SODIUM CHLORIDE 0.9 % IR SOLN
Status: DC | PRN
  Administered 2019-06-11: 6000 mL

## 2019-06-11 MED ORDER — OXYCODONE-ACETAMINOPHEN 5-325 MG PO TABS
1.0000 | ORAL_TABLET | ORAL | Status: DC | PRN
  Administered 2019-06-11: 1 via ORAL

## 2019-06-11 MED ORDER — EPHEDRINE SULFATE-NACL 50-0.9 MG/10ML-% IV SOSY
PREFILLED_SYRINGE | INTRAVENOUS | Status: DC | PRN
  Administered 2019-06-11: 5 mg via INTRAVENOUS
  Administered 2019-06-11 (×3): 10 mg via INTRAVENOUS

## 2019-06-11 MED ORDER — GABAPENTIN 300 MG PO CAPS
ORAL_CAPSULE | ORAL | Status: AC
  Administered 2019-06-11: 300 mg via ORAL
  Filled 2019-06-11: qty 1

## 2019-06-11 MED ORDER — PROMETHAZINE HCL 25 MG/ML IJ SOLN: 6.2500 mg | INTRAMUSCULAR | Status: DC | PRN

## 2019-06-11 MED ORDER — DIPHENHYDRAMINE HCL 50 MG/ML IJ SOLN
INTRAMUSCULAR | Status: AC
  Filled 2019-06-11: qty 1

## 2019-06-11 MED ORDER — FENTANYL CITRATE (PF) 100 MCG/2ML IJ SOLN
INTRAMUSCULAR | Status: DC | PRN
  Administered 2019-06-11: 25 ug via INTRAVENOUS
  Administered 2019-06-11: 50 ug via INTRAVENOUS
  Administered 2019-06-11: 25 ug via INTRAVENOUS

## 2019-06-11 MED ORDER — MIDAZOLAM HCL 2 MG/2ML IJ SOLN
INTRAMUSCULAR | Status: AC
  Filled 2019-06-11: qty 2

## 2019-06-11 MED ORDER — ACETAMINOPHEN 500 MG PO TABS
ORAL_TABLET | ORAL | Status: AC
  Administered 2019-06-11: 1000 mg via ORAL
  Filled 2019-06-11: qty 2

## 2019-06-11 MED ORDER — PROPOFOL 10 MG/ML IV BOLUS
INTRAVENOUS | Status: AC
  Filled 2019-06-11: qty 40

## 2019-06-11 MED ORDER — HYDROMORPHONE HCL 1 MG/ML IJ SOLN
INTRAMUSCULAR | Status: AC
  Filled 2019-06-11: qty 1

## 2019-06-11 MED ORDER — POVIDONE-IODINE 10 % EX SWAB
2.0000 "application " | Freq: Once | CUTANEOUS | Status: AC
  Administered 2019-06-11: 2 via TOPICAL

## 2019-06-11 MED ORDER — DEXAMETHASONE SODIUM PHOSPHATE 10 MG/ML IJ SOLN
INTRAMUSCULAR | Status: AC
  Filled 2019-06-11: qty 1

## 2019-06-11 MED ORDER — BUPIVACAINE-EPINEPHRINE (PF) 0.25% -1:200000 IJ SOLN
INTRAMUSCULAR | Status: AC
  Filled 2019-06-11: qty 30

## 2019-06-11 MED ORDER — HYDROMORPHONE HCL 1 MG/ML IJ SOLN
0.5000 mg | INTRAMUSCULAR | Status: DC | PRN
  Administered 2019-06-11: 0.5 mg via INTRAVENOUS

## 2019-06-11 MED ORDER — LIDOCAINE-EPINEPHRINE (PF) 1 %-1:200000 IJ SOLN
INTRAMUSCULAR | Status: AC
  Filled 2019-06-11: qty 30

## 2019-06-11 MED ORDER — EPHEDRINE 5 MG/ML INJ
INTRAVENOUS | Status: AC
  Filled 2019-06-11: qty 10

## 2019-06-11 MED ORDER — BUPIVACAINE-EPINEPHRINE 0.5% -1:200000 IJ SOLN
INTRAMUSCULAR | Status: AC
  Filled 2019-06-11: qty 1

## 2019-06-11 MED ORDER — OXYCODONE-ACETAMINOPHEN 5-325 MG PO TABS: 1.0000 | ORAL_TABLET | ORAL | 0 refills | Status: AC | PRN

## 2019-06-11 MED ORDER — FENTANYL CITRATE (PF) 100 MCG/2ML IJ SOLN
25.0000 ug | INTRAMUSCULAR | Status: DC | PRN
  Administered 2019-06-11 (×3): 50 ug via INTRAVENOUS

## 2019-06-11 MED ORDER — KETOROLAC TROMETHAMINE 30 MG/ML IJ SOLN
INTRAMUSCULAR | Status: DC | PRN
  Administered 2019-06-11: 30 mg via INTRAVENOUS

## 2019-06-11 MED ORDER — LIDOCAINE 2% (20 MG/ML) 5 ML SYRINGE
INTRAMUSCULAR | Status: AC
  Filled 2019-06-11: qty 5

## 2019-06-11 MED ORDER — ACETAMINOPHEN 500 MG PO TABS
1000.0000 mg | ORAL_TABLET | Freq: Once | ORAL | Status: AC
  Administered 2019-06-11: 06:00:00 1000 mg via ORAL

## 2019-06-11 MED ORDER — CHLORHEXIDINE GLUCONATE 4 % EX LIQD: 60.0000 mL | Freq: Once | CUTANEOUS | Status: DC

## 2019-06-11 MED ORDER — LIDOCAINE-EPINEPHRINE (PF) 1 %-1:200000 IJ SOLN
INTRAMUSCULAR | Status: DC | PRN
  Administered 2019-06-11: 21 mL

## 2019-06-11 MED ORDER — DIPHENHYDRAMINE HCL 50 MG/ML IJ SOLN
INTRAMUSCULAR | Status: DC | PRN
  Administered 2019-06-11: 12.5 mg via INTRAVENOUS

## 2019-06-11 MED ORDER — DEXAMETHASONE SODIUM PHOSPHATE 10 MG/ML IJ SOLN
INTRAMUSCULAR | Status: DC | PRN
  Administered 2019-06-11: 10 mg via INTRAVENOUS

## 2019-06-11 MED ORDER — SODIUM CHLORIDE 0.9 % IV SOLN: INTRAVENOUS | Status: DC

## 2019-06-11 MED ORDER — LIDOCAINE 2% (20 MG/ML) 5 ML SYRINGE
INTRAMUSCULAR | Status: DC | PRN
  Administered 2019-06-11: 80 mg via INTRAVENOUS

## 2019-06-11 MED ORDER — ONDANSETRON HCL 4 MG/2ML IJ SOLN
INTRAMUSCULAR | Status: AC
  Filled 2019-06-11: qty 2

## 2019-06-11 MED ORDER — OXYCODONE-ACETAMINOPHEN 5-325 MG PO TABS
ORAL_TABLET | ORAL | Status: AC
  Filled 2019-06-11: qty 1

## 2019-06-11 MED ORDER — GABAPENTIN 300 MG PO CAPS
300.0000 mg | ORAL_CAPSULE | Freq: Once | ORAL | Status: AC
  Administered 2019-06-11: 06:00:00 300 mg via ORAL

## 2019-06-11 MED ORDER — KETOROLAC TROMETHAMINE 30 MG/ML IJ SOLN
INTRAMUSCULAR | Status: AC
  Filled 2019-06-11: qty 1

## 2019-06-11 MED ORDER — ONDANSETRON HCL 4 MG/2ML IJ SOLN
INTRAMUSCULAR | Status: DC | PRN
  Administered 2019-06-11: 4 mg via INTRAVENOUS

## 2019-06-11 MED ORDER — PHENYLEPHRINE 40 MCG/ML (10ML) SYRINGE FOR IV PUSH (FOR BLOOD PRESSURE SUPPORT)
PREFILLED_SYRINGE | INTRAVENOUS | Status: DC | PRN
  Administered 2019-06-11: 80 ug via INTRAVENOUS

## 2019-06-11 MED ORDER — PROPOFOL 10 MG/ML IV BOLUS
INTRAVENOUS | Status: DC | PRN
  Administered 2019-06-11: 150 mg via INTRAVENOUS

## 2019-06-11 MED ORDER — FENTANYL CITRATE (PF) 100 MCG/2ML IJ SOLN
INTRAMUSCULAR | Status: AC
  Filled 2019-06-11: qty 2

## 2019-06-11 MED ORDER — LACTATED RINGERS IV SOLN
INTRAVENOUS | Status: DC
  Administered 2019-06-11 (×2): via INTRAVENOUS

## 2019-06-11 MED ORDER — MIDAZOLAM HCL 5 MG/5ML IJ SOLN
INTRAMUSCULAR | Status: DC | PRN
  Administered 2019-06-11: 2 mg via INTRAVENOUS

## 2019-06-11 MED FILL — OXYCODONE-ACETAMINOPHEN 5-3: 5-325 | 4 days supply | Qty: 40 | Fill #0

## 2019-06-11 SURGICAL SUPPLY — 32 items
BANDAGE ACE 6X5 VEL STRL LF (GAUZE/BANDAGES/DRESSINGS) ×5 IMPLANT
BLADE CUDA SHAVER 3.5 (BLADE) ×3 IMPLANT
BLADE EXCALIBUR 4.0MM X 13CM (MISCELLANEOUS) ×1
BLADE EXCALIBUR 4.0X13 (MISCELLANEOUS) ×1 IMPLANT
BNDG CMPR 82X61 PLY HI ABS (GAUZE/BANDAGES/DRESSINGS) ×1
BNDG CONFORM 6X.82 1P STRL (GAUZE/BANDAGES/DRESSINGS) ×2 IMPLANT
BNDG GAUZE ELAST 4 BULKY (GAUZE/BANDAGES/DRESSINGS) ×3 IMPLANT
COVER WAND RF STERILE (DRAPES) IMPLANT
DISSECTOR  3.8MM X 13CM (MISCELLANEOUS) ×2
DISSECTOR 3.8MM X 13CM (MISCELLANEOUS) IMPLANT
DRAPE ARTHROSCOPY W/POUCH 114 (DRAPES) ×3 IMPLANT
DRSG EMULSION OIL 3X3 NADH (GAUZE/BANDAGES/DRESSINGS) ×3 IMPLANT
DURAPREP 26ML APPLICATOR (WOUND CARE) ×3 IMPLANT
ELECT MENISCUS 165MM 90D (ELECTRODE) IMPLANT
ELECT PENCIL ROCKER SW 15FT (MISCELLANEOUS) IMPLANT
ELECT REM PT RETURN 15FT ADLT (MISCELLANEOUS) IMPLANT
GLOVE BIOGEL PI IND STRL 8.5 (GLOVE) IMPLANT
GLOVE BIOGEL PI INDICATOR 8.5 (GLOVE)
GLOVE ECLIPSE 8.0 STRL XLNG CF (GLOVE) ×3 IMPLANT
GLOVE SURG ORTHO 8.0 STRL STRW (GLOVE) IMPLANT
GOWN STRL REUS W/ TWL LRG LVL3 (GOWN DISPOSABLE) ×1 IMPLANT
GOWN STRL REUS W/TWL 2XL LVL3 (GOWN DISPOSABLE) ×3 IMPLANT
GOWN STRL REUS W/TWL LRG LVL3 (GOWN DISPOSABLE) ×3
MANIFOLD NEPTUNE II (INSTRUMENTS) IMPLANT
PACK ARTHROSCOPY DSU (CUSTOM PROCEDURE TRAY) ×3 IMPLANT
PAD MASON LEG HOLDER (PIN) ×3 IMPLANT
PORT APPOLLO RF 90DEGREE MULTI (SURGICAL WAND) ×3 IMPLANT
SUT ETHILON 4 0 PS 2 18 (SUTURE) IMPLANT
TOWEL OR 17X26 10 PK STRL BLUE (TOWEL DISPOSABLE) ×3 IMPLANT
TUBING ARTHROSCOPY IRRIG 16FT (MISCELLANEOUS) ×3 IMPLANT
WATER STERILE IRR 1000ML POUR (IV SOLUTION) ×3 IMPLANT
WRAP KNEE MAXI GEL POST OP (GAUZE/BANDAGES/DRESSINGS) ×1001 IMPLANT

## 2019-06-11 NOTE — Op Note (Signed)
NAME: Shannon Andersen, Shannon Andersen MEDICAL RECORD [id number] ACCOUNT [id number] DATE OF BIRTH:[birth year] FACILITY: [facility] LOCATION: [facility] PHYSICIAN:Jakhi Dishman Sharlotte Alamo, MD  OPERATIVE REPORT  DATE OF PROCEDURE:  06/11/2019  PREOPERATIVE DIAGNOSIS:  Tear of medial meniscus, left knee with tricompartmental degenerative arthritis.  POSTOPERATIVE DIAGNOSIS:  Tear of medial meniscus, left knee with tricompartmental degenerative arthritis.  PROCEDURE: 1.  Diagnostic arthroscopy, left knee. 2.  Partial medial meniscectomy. 3.  Chondroplasty of patella and medial femoral condyle.  SURGEON:  Joni Fears, MD  ASSISTANT:  Biagio Borg, PA-C.  ANESTHESIA:  General laryngeal with local 1% Xylocaine with epinephrine.  COMPLICATIONS:  None.  DESCRIPTION OF PROCEDURE:  The patient was met in the holding area and identified the left knee as appropriate operative site and marked it accordingly.  The patient was then transported to room #2 and placed under general laryngeal anesthesia without  any difficulty.  The left lower extremity was then placed in a thigh holder.  Left leg was then prepped with DuraPrep from the thigh holder to the ankle.  Sterile draping was performed.  Timeout was called.  Diagnostic arthroscopy was performed using a medial and lateral parapatellar tendon puncture site.  Arthroscope was initially placed in the lateral compartment.  There was minimal clear yellow joint effusion.  Diagnostic arthroscopy revealed small ____  in the superior pouch.  This was debrided.  Any bleeding controlled with the Arthrocare wand.  There was diffuse chondromalacia of the patella representing grade II changes.  These were shaved with a Cuda shaver.  There was very minimal chondromalacia in  the corresponding trochlea.  There was a fair amount of beefy red synovitis that I resected with the Cuda shaver.  In the medial compartment, there was considerable pathology with areas of grade IV  chondromalacia, particularly on the tibia and to a lesser extent on the femoral condyle.  Loose articular cartilage was debrided with a Cuda shaver.  There was a complex  tear of the posterior horn of the medial meniscus and a flipped fragment of the root.  This was debrided with basket forceps, a Cuda shaver and the Arthrocare wand as it was nicely tapered towards the anterior aspect of the meniscus.  There was  maceration of the anterior horn, which was debrided with a Cuda shaver.  Any synovitis was also resected.  I carefully probed the remaining meniscus without evidence of any loose fragments.  ACL and PCL appeared to be intact.  There were some grade I and II changes of chondromalacia laterally.  Lateral meniscus was intact.  The joint was then irrigated and no obvious loose material remaining.  Arthroscopic portals were infiltrated with 0.25% Marcaine with epinephrine and left open.  Sterile bulky dressing was applied followed by an Ace bandage.  PLAN:  Oxycodone for pain.  Crutches.  Office 1 week.  TN/NUANCE  D:06/11/2019 T:06/11/2019 JOB:[id number]

## 2019-06-11 NOTE — Anesthesia Procedure Notes (Signed)
Procedure Name: LMA Insertion Date/Time: 06/11/2019 7:36 AM Performed by: Genelle Bal, CRNA Pre-anesthesia Checklist: Patient identified, Emergency Drugs available, Suction available and Patient being monitored Patient Re-evaluated:Patient Re-evaluated prior to induction Oxygen Delivery Method: Circle system utilized Preoxygenation: Pre-oxygenation with 100% oxygen Induction Type: IV induction Ventilation: Mask ventilation without difficulty LMA: LMA inserted LMA Size: 4.0 Number of attempts: 1 Airway Equipment and Method: Bite block Placement Confirmation: positive ETCO2 Tube secured with: Tape Dental Injury: Teeth and Oropharynx as per pre-operative assessment

## 2019-06-11 NOTE — Transfer of Care (Signed)
Immediate Anesthesia Transfer of Care Note  Patient: Shannon Andersen  Procedure(s) Performed: LEFT KNEE ARTHROSCOPY WITH MEDIAL MENISECTOMY CHONDROPLASTY PATELLA  AND MEDIAL CONDYLE (Left )  Patient Location: PACU  Anesthesia Type:General  Level of Consciousness: awake, alert  and oriented  Airway & Oxygen Therapy: Patient Spontanous Breathing and Patient connected to face mask oxygen  Post-op Assessment: Report given to RN and Post -op Vital signs reviewed and stable  Post vital signs: Reviewed and stable  Last Vitals:  Vitals Value Taken Time  BP 106/74   Temp    Pulse 85   Resp 14   SpO2 97%     Last Pain:  Vitals:   06/11/19 0557  TempSrc: Oral  PainSc:       Patients Stated Pain Goal: 4 (38/18/29 9371)  Complications: No apparent anesthesia complications

## 2019-06-11 NOTE — Anesthesia Postprocedure Evaluation (Signed)
Anesthesia Post Note  Patient: Shannon Andersen  Procedure(s) Performed: LEFT KNEE ARTHROSCOPY WITH MEDIAL MENISECTOMY CHONDROPLASTY PATELLA  AND MEDIAL CONDYLE (Left )     Patient location during evaluation: PACU Anesthesia Type: General Level of consciousness: awake and alert, awake and oriented Pain management: pain level controlled Vital Signs Assessment: post-procedure vital signs reviewed and stable Respiratory status: spontaneous breathing, nonlabored ventilation, respiratory function stable and patient connected to nasal cannula oxygen Cardiovascular status: blood pressure returned to baseline and stable Postop Assessment: no apparent nausea or vomiting Anesthetic complications: no    Last Vitals:  Vitals:   06/11/19 1129 06/11/19 1230  BP: (!) 149/99 140/80  Pulse: 83 82  Resp: 16 15  Temp: 36.7 C   SpO2: 99% 98%    Last Pain:  Vitals:   06/11/19 1230  TempSrc:   PainSc: 3                  Catalina Gravel

## 2019-06-11 NOTE — Op Note (Signed)
PATIENT ID:      Shannon Andersen  MRN:     [id number] DOB/AGE:    August 25, 1956 / 63 y.o.       OPERATIVE REPORT    DATE OF PROCEDURE:  06/11/2019       PREOPERATIVE DIAGNOSIS:   Tear medial meniscus left knee with tri compartmental degenerative arthritis                                                       Estimated body mass index is 26.4 kg/m as calculated from the following:   Height as of this encounter: 5\' 10"  (1.778 m).   Weight as of this encounter: 83.5 kg.     POSTOPERATIVE DIAGNOSIS:   same                                                                   Estimated body mass index is 26.4 kg/m as calculated from the following:   Height as of this encounter: 5\' 10"  (1.778 m).   Weight as of this encounter: 83.5 kg.     PROCEDURE:  Procedure(s): LEFT KNEE ARTHROSCOPY WITH PARTIAL MEDIAL MENISECTOMY, CHONDROPLASTY PATELLA  AND MEDIAL CONDYLE      SURGEON:  Joni Fears, MD    ASSISTANT:   Biagio Borg, PA-C   (Present and scrubbed throughout the case, critical for assistance with exposure, retraction, instrumentation, and closure.)          ANESTHESIA: local and general     DRAINS: none :      TOURNIQUET TIME: * No tourniquets in log *    COMPLICATIONS:  None   CONDITION:  stable  PROCEDURE IN DETAIL: [id number]   Shannon Andersen 06/11/2019, 8:20 AM

## 2019-06-11 NOTE — Telephone Encounter (Signed)
Done

## 2019-06-11 NOTE — H&P (Signed)
The recent History & Physical has been reviewed. I have personally examined the patient today. There is no interval change to the documented History & Physical. The patient would like to proceed with the procedure.  Garald Balding 06/11/2019,  7:21 AM

## 2019-06-11 NOTE — Telephone Encounter (Signed)
Magda Paganini with [facility] called and needs pain medication orders on patient. Please call Magda Paganini at [phone] to advise.

## 2019-06-12 ENCOUNTER — Encounter (HOSPITAL_COMMUNITY): Payer: Self-pay | Admitting: Orthopaedic Surgery

## 2019-06-18 ENCOUNTER — Inpatient Hospital Stay: Payer: 59 | Admitting: Orthopaedic Surgery

## 2019-06-19 MED FILL — MELOXICAM 15 MG TABLET: 15 | 90 days supply | Qty: 90 | Fill #2

## 2019-06-19 MED FILL — VIT D2 1.25 MG (50,000 UNIT: 1.25 MG | 84 days supply | Qty: 36 | Fill #1

## 2019-06-21 ENCOUNTER — Ambulatory Visit (INDEPENDENT_AMBULATORY_CARE_PROVIDER_SITE_OTHER): Payer: 59 | Admitting: Orthopaedic Surgery

## 2019-06-21 ENCOUNTER — Other Ambulatory Visit: Payer: Self-pay

## 2019-06-21 ENCOUNTER — Encounter: Payer: Self-pay | Admitting: Orthopaedic Surgery

## 2019-06-21 DIAGNOSIS — M23322 Other meniscus derangements, posterior horn of medial meniscus, left knee: Secondary | ICD-10-CM

## 2019-06-21 NOTE — Progress Notes (Signed)
Office Visit Note   Patient: Shannon BrownRuby W Mikkelson           Date of Birth: 01/26/1956           MRN: [id number] Visit Date: 06/21/2019              Requested by: Chilton GreathouseAvva, Ravisankar, MD [address] [place] [id number] PCP: Chilton GreathouseAvva, Ravisankar, MD   Assessment & Plan: Visit Diagnoses:  1. Other meniscus derangements, posterior horn of medial meniscus, left knee     Plan: 1 week status post left knee arthroscopy with debridement of a complex tear of the posterior horn of the medial meniscus and chondroplasty of the patella and medial compartment.  Doing well.  Not taking any pain medicine and not using any ambulatory aid.  Will return in 1 month and return to light-duty work on July 21 for 2 weeks  Follow-Up Instructions: Return in about 1 month (around 07/22/2019).   Orders:  No orders of the defined types were placed in this encounter.  No orders of the defined types were placed in this encounter.     Procedures: No procedures performed   Clinical Data: No additional findings.   Subjective: No chief complaint on file. 1 week status post left knee arthroscopy with debridement of a tear of the posterior horn of the medial meniscus and chondroplasty of the patella and medial femoral condyle.  Doing well.  No related fever or chills.  No calf pain.  No distal edema.  Not taking any pain medicine.  Not using tolerated  HPI  Review of Systems   Objective: Vital Signs: There were no vitals taken for this visit.  Physical Exam  Ortho Exam slight redness along the lateral arthroscopic portal left knee but no drainage.  Medial arthroscopic portal healing without problem.  Knee was not hot red warm or swollen.  Walk with minimal limp.  Full extension flexion over 100 degrees without instability.  No calf pain.  Neurologically intact  Specialty Comments:  No specialty comments available.  Imaging: No results found.   PMFS History: Patient Active Problem List   Diagnosis Date Noted  . Other meniscus derangements, posterior horn of medial meniscus, left knee 06/11/2019  . Primary osteoarthritis of left knee 06/11/2019  . Chondromalacia patellae, left knee 06/11/2019  . Chronic pain of left knee 05/23/2019  . Dislocation, shoulder, anterior 10/01/2013  . 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 Osteopenia   . Osteoporosis    STIFFNESS IN HANDS    History reviewed. No pertinent family history.  Past Surgical History:  Procedure Laterality Date  . BANKART REPAIR Right 10/01/2013   Procedure: Right Shoulder Arthroscopic Debridement with Labral Repair;  Surgeon: Valeria BatmanPeter W Britt Theard, MD;  [facility];  Service: Orthopedics;  Laterality: Right;  . BREAST SURGERY Left    BIOPSY-BENIGN  . COLONOSCOPY WITH PROPOFOL N/A 09/07/2015   Procedure: COLONOSCOPY WITH PROPOFOL;  Surgeon: Charolett BumpersMartin K Rutledge, MD;  Location: [facility] END[facility]COPY;  Service: Endoscopy;  Laterality: N/A;  . KNEE ARTHROSCOPY WITH MEDIAL MENISECTOMY Left 06/11/2019   Procedure: LEFT KNEE ARTHROSCOPY WITH MEDIAL MENISECTOMY CHONDROPLASTY PATELLA  AND MEDIAL CONDYLE;  Surgeon: Valeria BatmanWhitfield, Tierney Behl W, MD;  Location: [facility] ORS;  Service: Orthopedics;  Laterality: Left;  . TONSILLECTOMY     Social History   Occupational History  . Not on file  Tobacco Use  . Smoking status: Never Smoker  . Smokeless tobacco: Never Used  Substance  and Sexual Activity  . Alcohol use: Not on file    Comment: rarely  . Drug use: No  . Sexual activity: Not on file     Garald Balding, MD   Note - This record has been created using [employer].  Chart creation errors have been sought, but may not always  have been located. Such creation errors do not reflect on  the standard of medical care.

## 2019-06-28 MED FILL — OMEGA-3 ETHYL ESTERS 1 GM C: 1 | 30 days supply | Qty: 120 | Fill #3

## 2019-07-15 DIAGNOSIS — Z6826 Body mass index (BMI) 26.0-26.9, adult: Secondary | ICD-10-CM | POA: Diagnosis not present

## 2019-07-15 DIAGNOSIS — Z01419 Encounter for gynecological examination (general) (routine) without abnormal findings: Secondary | ICD-10-CM | POA: Diagnosis not present

## 2019-07-17 ENCOUNTER — Encounter: Payer: Self-pay | Admitting: Orthopaedic Surgery

## 2019-07-17 ENCOUNTER — Other Ambulatory Visit: Payer: Self-pay

## 2019-07-17 ENCOUNTER — Ambulatory Visit (INDEPENDENT_AMBULATORY_CARE_PROVIDER_SITE_OTHER): Payer: 59 | Admitting: Orthopaedic Surgery

## 2019-07-17 VITALS — BP 109/72 | HR 74 | Ht 70.0 in | Wt 184.0 lb

## 2019-07-17 DIAGNOSIS — M23322 Other meniscus derangements, posterior horn of medial meniscus, left knee: Secondary | ICD-10-CM

## 2019-07-17 NOTE — Progress Notes (Signed)
Office Visit Note   Patient: [place]en           Date of Birth: 1955/12/14           MRN: [id number] Visit Date: 07/17/2019              Requested by: Chilton GreathouseAvva, Ravisankar, MD [address] [place],  [place] [id number] PCP: Chilton GreathouseAvva, Ravisankar, MD   Assessment & Plan: Visit Diagnoses:  1. Other meniscus derangements, posterior horn of medial meniscus, left knee     Plan: 5 weeks status post left knee arthroscopy with partial medial meniscectomy and chondroplasty patella and medial compartment.  Doing well.  Working without much trouble.  Still some swelling and occasional discomfort.  Will continue working with exercises and return in a month if still having a problem.  Consider cortisone injection  Follow-Up Instructions: Return if symptoms worsen or fail to improve.   Orders:  No orders of the defined types were placed in this encounter.  No orders of the defined types were placed in this encounter.     Procedures: No procedures performed   Clinical Data: No additional findings.   Subjective: Chief Complaint  Patient presents with  . Left Knee - Routine Post Op    Left knee scope DOS 06/11/2019  Patient presents today for a follow up on her left knee. She is now 5 weeks out from a left knee arthroscopy with debridement of a tear of the posterior horn of the medial meniscus and chondroplasty of the patella and medial femoral condyle. She is improving each week. She is not taking anything for pain, but does ice it as needed. HPI  Review of Systems   Objective: Vital Signs: BP 109/72   Pulse 74   Ht 5\' 10"  (1.778 m)   Wt 184 lb (83.5 kg)   BMI 26.40 kg/m   Physical Exam  Ortho Exam left knee with well-healed arthroscopic portals.  No evidence of infection.  No calf pain.  Full extension flexion over 100 degrees without instability.  Very minimal effusion.  Still having some swelling around the arthroscopic portals but no pain.  No use of ambulatory aid Specialty  Comments:  No specialty comments available.  Imaging: No results found.   PMFS History: Patient Active Problem List   Diagnosis Date Noted  . Other meniscus derangements, posterior horn of medial meniscus, left knee 06/11/2019  . Primary osteoarthritis of left knee 06/11/2019  . Chondromalacia patellae, left knee 06/11/2019  . Chronic pain of left knee 05/23/2019  . Dislocation, shoulder, anterior 10/01/2013  . 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 Osteopenia   . Osteoporosis    STIFFNESS IN HANDS    History reviewed. No pertinent family history.  Past Surgical History:  Procedure Laterality Date  . BANKART REPAIR Right 10/01/2013   Procedure: Right Shoulder Arthroscopic Debridement with Labral Repair;  Surgeon: Valeria BatmanPeter W Ryson Bacha, MD;  [facility];  Service: Orthopedics;  Laterality: Right;  . BREAST SURGERY Left    BIOPSY-BENIGN  . COLONOSCOPY WITH PROPOFOL N/A 09/07/2015   Procedure: COLONOSCOPY WITH PROPOFOL;  Surgeon: Charolett BumpersMartin K July, MD;  Location: [facility];  Service: Endoscopy;  Laterality: N/A;  . KNEE ARTHROSCOPY WITH MEDIAL MENISECTOMY Left 06/11/2019   Procedure: LEFT KNEE ARTHROSCOPY WITH MEDIAL MENISECTOMY CHONDROPLASTY PATELLA  AND MEDIAL CONDYLE;  Surgeon: Valeria BatmanWhitfield, Alby Schwabe W, MD;  Location: [facility];  Service: Orthopedics;  Laterality: Left;  . TONSILLECTOMY  Social History   Occupational History  . Not on file  Tobacco Use  . Smoking status: Never Smoker  . Smokeless tobacco: Never Used  Substance and Sexual Activity  . Alcohol use: Not on file    Comment: rarely  . Drug use: No  . Sexual activity: Not on file

## 2019-08-12 DIAGNOSIS — Z1231 Encounter for screening mammogram for malignant neoplasm of breast: Secondary | ICD-10-CM | POA: Diagnosis not present

## 2019-08-15 MED FILL — ROSUVASTATIN CALCIUM 5 MG T: 5 | 90 days supply | Qty: 90 | Fill #3

## 2019-08-15 MED FILL — OMEGA-3 ETHYL ESTERS 1 GM C: 1 | 30 days supply | Qty: 120 | Fill #4

## 2019-08-23 MED FILL — VENLAFAXINE HCL ER 150 MG C: 150 | 90 days supply | Qty: 90 | Fill #1

## 2019-09-16 MED FILL — VIT D2 1.25 MG (50,000 UNIT: 1.25 MG | 84 days supply | Qty: 36 | Fill #0

## 2019-09-16 MED FILL — MELOXICAM 15 MG TABLET: 15 | 90 days supply | Qty: 90 | Fill #0

## 2019-10-04 MED FILL — OMEGA-3 ETHYL ESTERS 1 GM C: 1 | 30 days supply | Qty: 120 | Fill #5

## 2019-10-19 IMAGING — DX CHEST - 2 VIEW
2 series · 2 of 2 positions shown · non-contrast
Comparison: September 27, 2013.

CLINICAL DATA: Preoperative assessment for left knee surgery

EXAM:
CHEST - 2 VIEW

[chest pa]
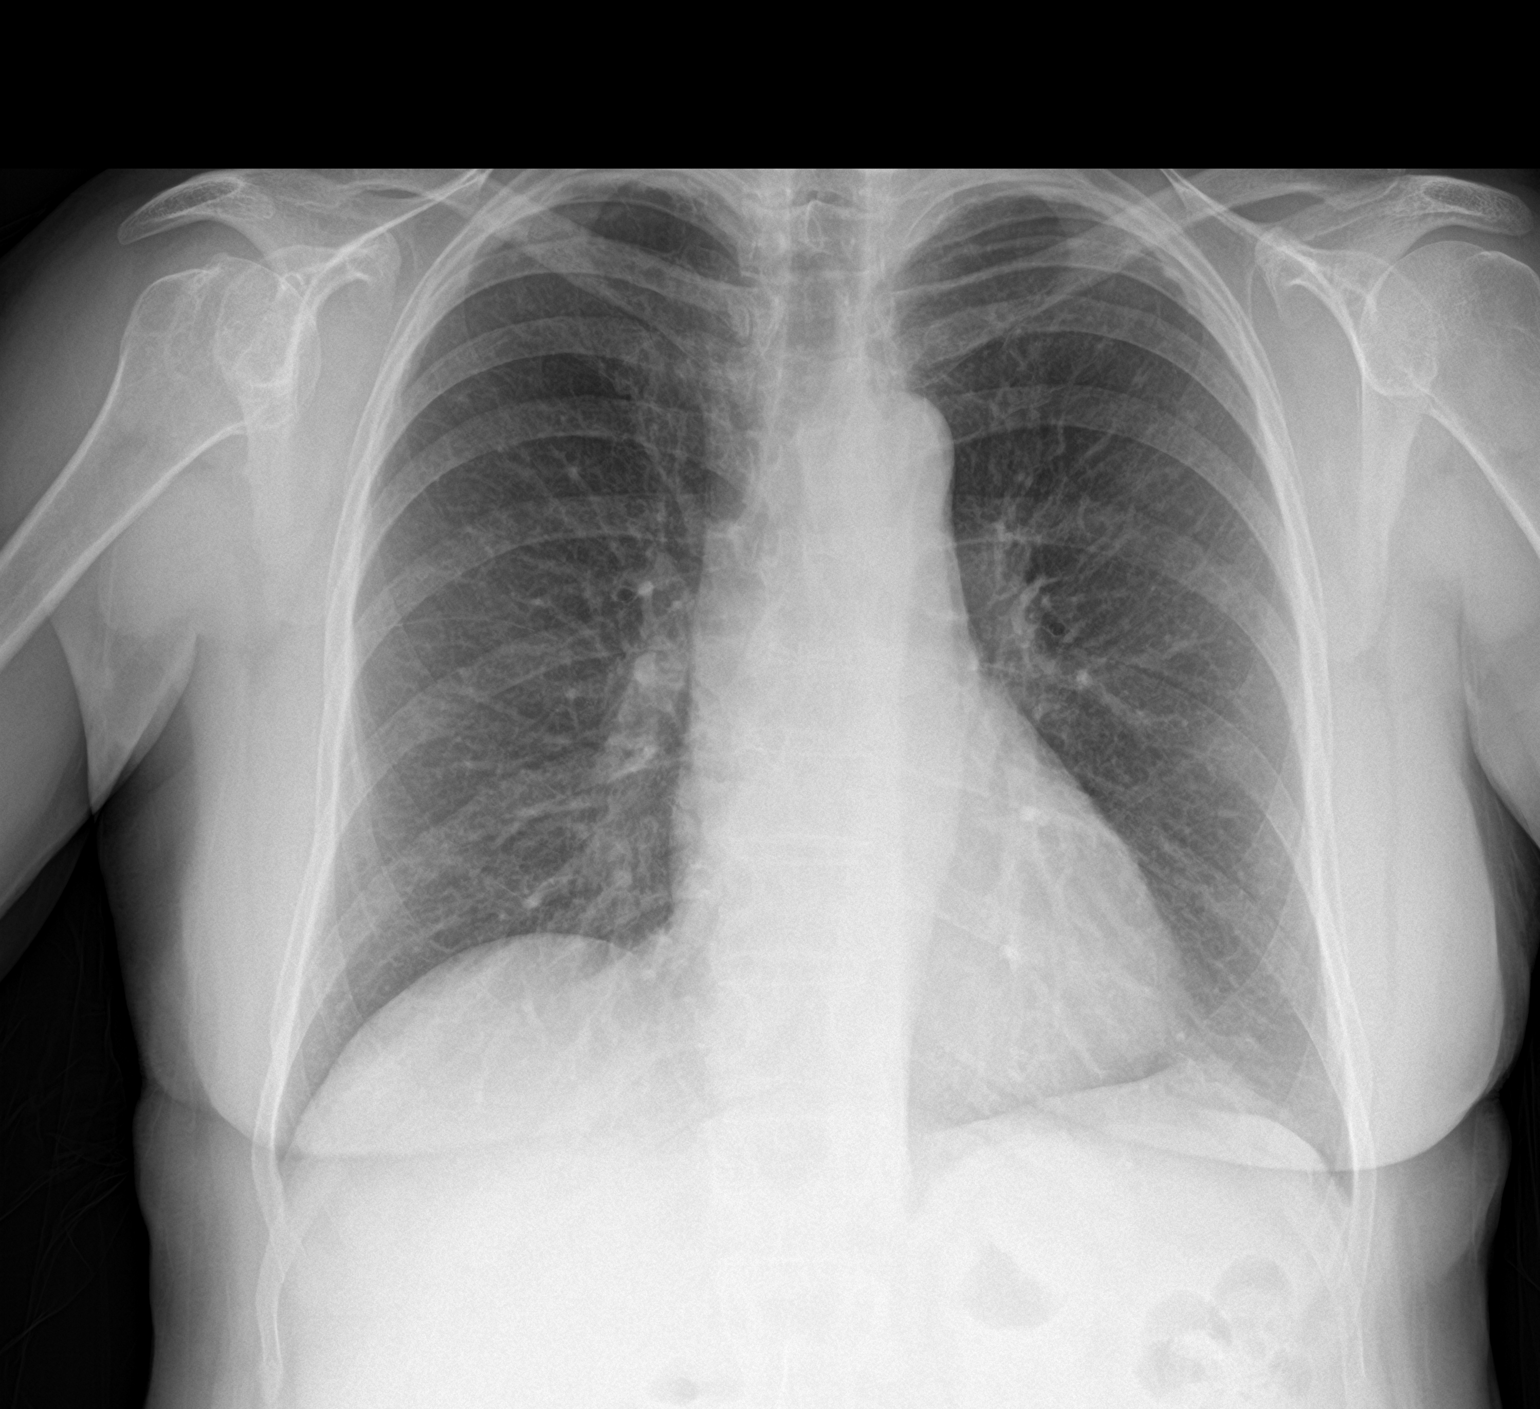

[chest lat]
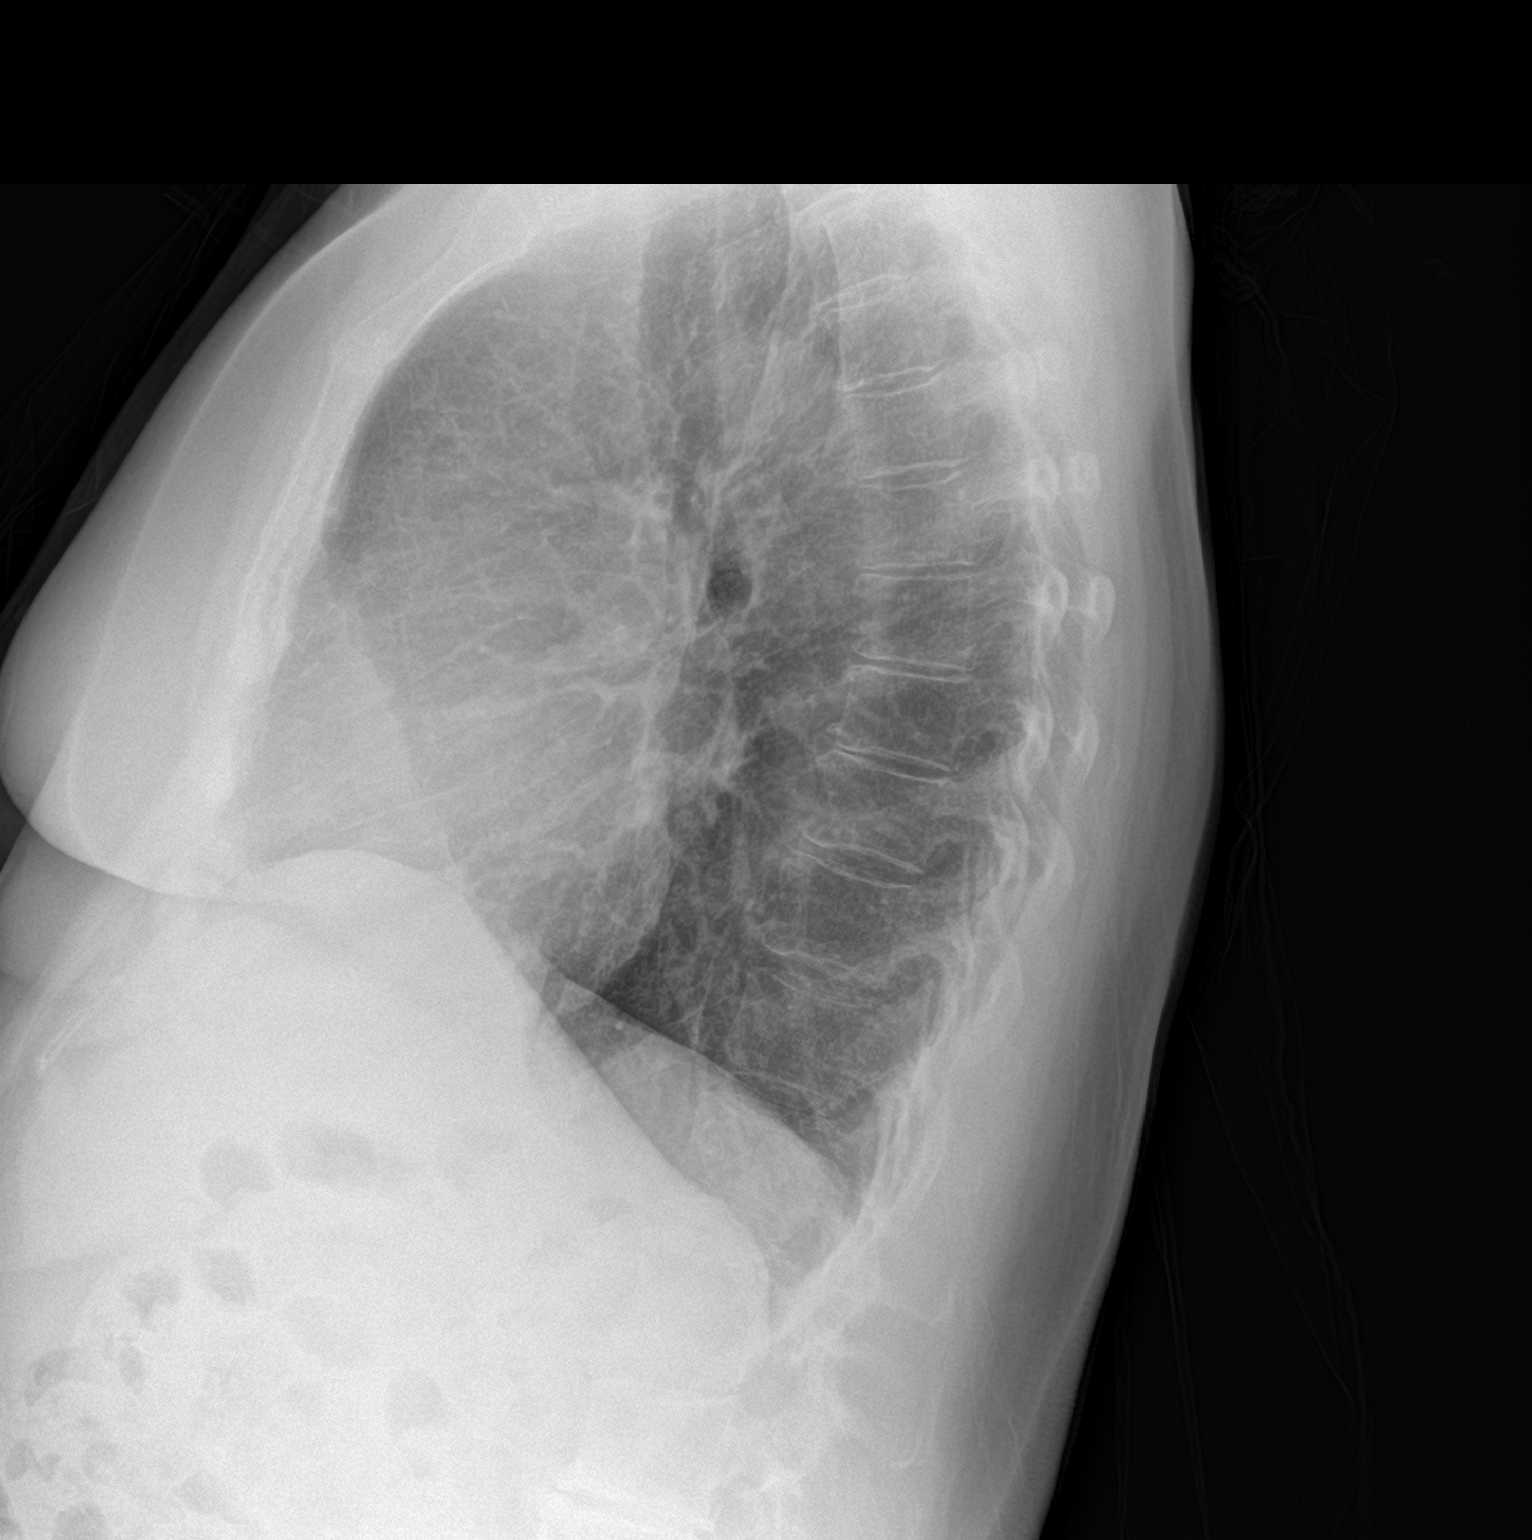

[2 of 2 positions shown; findings below may reference images not displayed]

FINDINGS: There is slight atelectasis in the left base. The lungs elsewhere
are clear. Heart size and pulmonary vascularity are normal. No
adenopathy. No bone lesions.
IMPRESSION: Slight left base atelectasis. Lungs elsewhere clear. Stable cardiac
silhouette.

## 2019-11-11 MED FILL — OMEGA-3 ETHYL ESTERS 1 GM C: 1 | 30 days supply | Qty: 120 | Fill #6

## 2019-11-11 MED FILL — ROSUVASTATIN CALCIUM 5 MG T: 5 | 90 days supply | Qty: 90 | Fill #0

## 2019-11-27 MED FILL — VENLAFAXINE HCL ER 150 MG C: 150 | 90 days supply | Qty: 90 | Fill #2

## 2019-11-27 MED FILL — VIT D2 1.25 MG (50,000 UNIT: 1.25 MG | 84 days supply | Qty: 36 | Fill #1

## 2019-12-17 DIAGNOSIS — M859 Disorder of bone density and structure, unspecified: Secondary | ICD-10-CM | POA: Diagnosis not present

## 2019-12-17 DIAGNOSIS — E7849 Other hyperlipidemia: Secondary | ICD-10-CM | POA: Diagnosis not present

## 2019-12-17 MED FILL — MELOXICAM 15 MG TABLET: 15 | 90 days supply | Qty: 90 | Fill #1

## 2019-12-24 ENCOUNTER — Telehealth: Payer: Self-pay | Admitting: Orthopaedic Surgery

## 2019-12-24 DIAGNOSIS — Z0000 Encounter for general adult medical examination without abnormal findings: Secondary | ICD-10-CM | POA: Diagnosis not present

## 2019-12-24 DIAGNOSIS — M8580 Other specified disorders of bone density and structure, unspecified site: Secondary | ICD-10-CM | POA: Diagnosis not present

## 2019-12-24 DIAGNOSIS — J302 Other seasonal allergic rhinitis: Secondary | ICD-10-CM | POA: Diagnosis not present

## 2019-12-24 DIAGNOSIS — Z1331 Encounter for screening for depression: Secondary | ICD-10-CM | POA: Diagnosis not present

## 2019-12-24 DIAGNOSIS — E785 Hyperlipidemia, unspecified: Secondary | ICD-10-CM | POA: Diagnosis not present

## 2019-12-24 DIAGNOSIS — M2550 Pain in unspecified joint: Secondary | ICD-10-CM | POA: Diagnosis not present

## 2019-12-24 DIAGNOSIS — M25562 Pain in left knee: Secondary | ICD-10-CM | POA: Diagnosis not present

## 2019-12-24 NOTE — Telephone Encounter (Signed)
Please advise 

## 2019-12-24 NOTE — Telephone Encounter (Signed)
Patient called advised she would like to get the injection in her left  knee. Patient was not sure if the insurance needed to be contacted for approval.   The number to contact patient is [phone]

## 2019-12-24 NOTE — Telephone Encounter (Signed)
No preapproval needed for cortisone but will need for visco-please call and ask which injection she is seeking. Thanks

## 2019-12-24 NOTE — Telephone Encounter (Signed)
Spoke with patient and scheduled appointment for cortisone injection, based on last office note dictation.

## 2019-12-25 DIAGNOSIS — R82998 Other abnormal findings in urine: Secondary | ICD-10-CM | POA: Diagnosis not present

## 2020-01-01 ENCOUNTER — Ambulatory Visit (INDEPENDENT_AMBULATORY_CARE_PROVIDER_SITE_OTHER): Payer: 59 | Admitting: Orthopaedic Surgery

## 2020-01-01 ENCOUNTER — Other Ambulatory Visit: Payer: Self-pay

## 2020-01-01 ENCOUNTER — Encounter: Payer: Self-pay | Admitting: Orthopaedic Surgery

## 2020-01-01 VITALS — Ht 70.0 in | Wt 178.0 lb

## 2020-01-01 DIAGNOSIS — M1712 Unilateral primary osteoarthritis, left knee: Secondary | ICD-10-CM

## 2020-01-01 MED ORDER — METHYLPREDNISOLONE ACETATE 40 MG/ML IJ SUSP
80.0000 mg | INTRAMUSCULAR | Status: AC | PRN
  Administered 2020-01-01: 09:00:00 80 mg via INTRA_ARTICULAR

## 2020-01-01 MED ORDER — BUPIVACAINE HCL 0.5 % IJ SOLN
2.0000 mL | INTRAMUSCULAR | Status: AC | PRN
  Administered 2020-01-01: 2 mL via INTRA_ARTICULAR

## 2020-01-01 MED ORDER — LIDOCAINE HCL 1 % IJ SOLN
2.0000 mL | INTRAMUSCULAR | Status: AC | PRN
  Administered 2020-01-01: 09:00:00 2 mL

## 2020-01-01 NOTE — Progress Notes (Signed)
Office Visit Note   Patient: Shannon Andersen           Date of Birth: 1956/08/06           MRN: [id number] Visit Date: 01/01/2020              Requested by: Prince Solian, MD [address] [place],  [place] [address] PCP: Prince Solian, MD   Assessment & Plan: Visit Diagnoses:  1. Primary osteoarthritis of left knee     Plan: Prior left knee arthroscopy this past summer with a tear of the medial meniscus and obvious degenerative changes predominantly in the medial compartment.  The preoperative pain that she had from the meniscus has resolved but still having some arthritic pain and would like to have a cortisone injection today.  This was performed without difficulty Long discussion regarding exercises and future treatment including knee replacement.  We will see back as needed  Follow-Up Instructions: Return if symptoms worsen or fail to improve.   Orders:  Orders Placed This Encounter  Procedures  . Large Joint Inj: L knee   No orders of the defined types were placed in this encounter.     Procedures: Large Joint Inj: L knee on 01/01/2020 9:19 AM Indications: pain and diagnostic evaluation Details: 25 G 1.5 in needle, anteromedial approach  Arthrogram: No  Medications: 2 mL lidocaine 1 %; 2 mL bupivacaine 0.5 %; 80 mg methylPREDNISolone acetate 40 MG/ML Procedure, treatment alternatives, risks and benefits explained, specific risks discussed. Consent was given by the patient. Patient was prepped and draped in the usual sterile fashion.       Clinical Data: No additional findings.   Subjective: Chief Complaint  Patient presents with  . Left Knee - Pain  Patient presents today for her left knee. She had a left knee arthroscopy on 06/11/2019. She is having a lot more discomfort in her knee. Walking causes her knee to hurt more. She is taking meloxicam regularly.  Pain is localized along the medial compartment and worse when she is on her feet for long periods  of time.  HPI  Review of Systems   Objective: Vital Signs: Ht 5\' 10"  (1.778 m)   Wt 178 lb (80.7 kg)   BMI 25.54 kg/m   Physical Exam Constitutional:      Appearance: She is well-developed.  Eyes:     Pupils: Pupils are equal, round, and reactive to light.  Pulmonary:     Effort: Pulmonary effort is normal.  Skin:    General: Skin is warm and dry.  Neurological:     Mental Status: She is alert and oriented to person, place, and time.  Psychiatric:        Behavior: Behavior normal.     Ortho Exam awake alert and oriented x3.  Comfortable sitting.  Little bit of a hop and limp when first getting up from a sitting position referable to her left knee.  Left knee was not hot red warm or swollen.  Predominately medial joint pain.  Slight varus with weightbearing.  Full extension flexed over 100 degrees without instability.  No patella or lateral joint pain.  No calf pain.  Neurologically intact.  No effusion  Specialty Comments:  No specialty comments available.  Imaging: No results found.   PMFS History: Patient Active Problem List   Diagnosis Date Noted  . Other meniscus derangements, posterior horn of medial meniscus, left knee 06/11/2019  . Primary osteoarthritis of left knee 06/11/2019  .  Chondromalacia patellae, left knee 06/11/2019  . Chronic pain of left knee 05/23/2019  . Dislocation, shoulder, anterior 10/01/2013  . 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 Osteopenia   . Osteoporosis    STIFFNESS IN HANDS    History reviewed. No pertinent family history.  Past Surgical History:  Procedure Laterality Date  . BANKART REPAIR Right 10/01/2013   Procedure: Right Shoulder Arthroscopic Debridement with Labral Repair;  Surgeon: Valeria Batman, MD;  Location: [facility] OR;  Service: Orthopedics;  Laterality: Right;  . BREAST SURGERY Left    BIOPSY-BENIGN  . COLONOSCOPY WITH PROPOFOL N/A 09/07/2015   Procedure: COLONOSCOPY WITH  PROPOFOL;  Surgeon: Charolett Bumpers, MD;  Location: [facility];  Service: Endoscopy;  Laterality: N/A;  . KNEE ARTHROSCOPY WITH MEDIAL MENISECTOMY Left 06/11/2019   Procedure: LEFT KNEE ARTHROSCOPY WITH MEDIAL MENISECTOMY CHONDROPLASTY PATELLA  AND MEDIAL CONDYLE;  Surgeon: Valeria Batman, MD;  Location: [facility];  Service: Orthopedics;  Laterality: Left;  . TONSILLECTOMY     Social History   Occupational History  . Not on file  Tobacco Use  . Smoking status: Never Smoker  . Smokeless tobacco: Never Used  Substance and Sexual Activity  . Alcohol use: Not on file    Comment: rarely  . Drug use: No  . Sexual activity: Not on file

## 2020-01-04 MED FILL — OMEGA-3 ETHYL ESTERS 1 GM C: 1 | 30 days supply | Qty: 120 | Fill #7

## 2020-01-10 DIAGNOSIS — Z1212 Encounter for screening for malignant neoplasm of rectum: Secondary | ICD-10-CM | POA: Diagnosis not present

## 2020-02-09 MED FILL — OMEGA-3 ETHYL ESTERS 1 GM C: 1 | 30 days supply | Qty: 120 | Fill #8

## 2020-02-10 MED FILL — ROSUVASTATIN CALCIUM 5 MG T: 5 | 90 days supply | Qty: 90 | Fill #0

## 2020-02-23 MED FILL — VENLAFAXINE HCL ER 150 MG C: 150 | 90 days supply | Qty: 90 | Fill #3

## 2020-02-24 MED FILL — VIT D2 1.25 MG (50,000 UNIT: 1.25 MG | 84 days supply | Qty: 36 | Fill #0

## 2020-03-12 MED FILL — MELOXICAM 15 MG TABLET: 15 | 90 days supply | Qty: 90 | Fill #2

## 2020-03-27 ENCOUNTER — Other Ambulatory Visit (HOSPITAL_COMMUNITY): Payer: Self-pay | Admitting: Internal Medicine

## 2020-03-27 MED FILL — OMEGA-3 ETHYL ESTERS 1 GM C: 1 | 30 days supply | Qty: 120 | Fill #0

## 2020-05-06 ENCOUNTER — Encounter: Payer: Self-pay | Admitting: Orthopaedic Surgery

## 2020-05-06 ENCOUNTER — Other Ambulatory Visit: Payer: Self-pay

## 2020-05-06 ENCOUNTER — Ambulatory Visit (INDEPENDENT_AMBULATORY_CARE_PROVIDER_SITE_OTHER): Payer: 59

## 2020-05-06 ENCOUNTER — Telehealth: Payer: Self-pay

## 2020-05-06 ENCOUNTER — Ambulatory Visit: Payer: 59 | Admitting: Orthopaedic Surgery

## 2020-05-06 VITALS — Ht 70.0 in | Wt 178.0 lb

## 2020-05-06 DIAGNOSIS — M1712 Unilateral primary osteoarthritis, left knee: Secondary | ICD-10-CM | POA: Diagnosis not present

## 2020-05-06 NOTE — Progress Notes (Addendum)
Office Visit Note   Patient: Shannon Andersen           Date of Birth: 03/15/1956           MRN: [id number] Visit Date: 05/06/2020              Requested by: Chilton Greathouse, MD [address] [place],  [place] [address] PCP: Chilton Greathouse, MD   Assessment & Plan: Visit Diagnoses:  1. Primary osteoarthritis of left knee     Plan: X-rays were obtained of the left knee today demonstrating progressive collapse of the medial compartment.  There are peripheral osteophytes and subchondral sclerosis.  There is a been obvious progression of the arthritis which would explain her persistent pain.  Long discussion regarding treatment options including knee replacement.  She would like to try viscosupplementation.  Will call the insurance company to get approval. This patient is diagnosed with osteoarthritis of the knee(s).    Radiographs show evidence of joint space narrowing, osteophytes, subchondral sclerosis and/or subchondral cysts.  This patient has knee pain which interferes with functional and activities of daily living.    This patient has experienced inadequate response, adverse effects and/or intolerance with conservative treatments such as acetaminophen, NSAIDS, topical creams, physical therapy or regular exercise, knee bracing and/or weight loss.   This patient has experienced inadequate response or has a contraindication to intra articular steroid injections for at least 3 months.   This patient is not scheduled to have a total knee replacement within 6 months of starting treatment with viscosupplementation.   Follow-Up Instructions: Return Precertify Visco supplementation.   Orders:  Orders Placed This Encounter  Procedures  . XR KNEE 3 VIEW LEFT   No orders of the defined types were placed in this encounter.     Procedures: No procedures performed   Clinical Data: No additional findings.   Subjective: Chief Complaint  Patient presents with  . Left Knee -  Follow-up  Patient presents today for recurrent left knee pain. She was here last in January and received a cortisone injection. She states that it may have helped for two weeks and then the pain returned. She wants to talk about other options available. Approximately a year status post left knee arthroscopy at which time she was noted to have some arthritic changes in the medial compartment as well as the meniscal tear at the root.  There were areas of grade IV chondromalacia medially and beneath the patella.  She had a cortisone injection that only helped for several weeks in January.  She does work on her feet up to 12-hour shifts and finds it is  becoming more more difficult  HPI  Review of Systems   Objective: Vital Signs: Ht 5\' 10"  (1.778 m)   Wt 178 lb (80.7 kg)   BMI 25.54 kg/m   Physical Exam Constitutional:      Appearance: She is well-developed.  Eyes:     Pupils: Pupils are equal, round, and reactive to light.  Pulmonary:     Effort: Pulmonary effort is normal.  Skin:    General: Skin is warm and dry.  Neurological:     Mental Status: She is alert and oriented to person, place, and time.  Psychiatric:        Behavior: Behavior normal.     Ortho Exam left knee was not hot red warm or swollen.  Possibly very small effusion.  Slight increased varus.  Full extension of flexed over 105 degrees without instability.  Predominately medial joint pain that is relatively mild yet diffuse.  Pain does not localize posteriorly.  No popliteal pain or mass.  No calf pain.  Painless range of motion both hips.  Straight leg raise negative.  Some patellar crepitation with flexion extension  Specialty Comments:  No specialty comments available.  Imaging: No results found.   PMFS History: Patient Active Problem List   Diagnosis Date Noted  . Other meniscus derangements, posterior horn of medial meniscus, left knee 06/11/2019  . Primary osteoarthritis of left knee 06/11/2019  .  Chondromalacia patellae, left knee 06/11/2019  . Chronic pain of left knee 05/23/2019  . Dislocation, shoulder, anterior 10/01/2013  . 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 Osteopenia   . Osteoporosis    STIFFNESS IN HANDS    History reviewed. No pertinent family history.  Past Surgical History:  Procedure Laterality Date  . BANKART REPAIR Right 10/01/2013   Procedure: Right Shoulder Arthroscopic Debridement with Labral Repair;  Surgeon: Garald Balding, MD;  Location: [facility];  Service: Orthopedics;  Laterality: Right;  . BREAST SURGERY Left    BIOPSY-BENIGN  . COLONOSCOPY WITH PROPOFOL N/A 09/07/2015   Procedure: COLONOSCOPY WITH PROPOFOL;  Surgeon: Garlan Fair, MD;  Location: [facility];  Service: Endoscopy;  Laterality: N/A;  . KNEE ARTHROSCOPY WITH MEDIAL MENISECTOMY Left 06/11/2019   Procedure: LEFT KNEE ARTHROSCOPY WITH MEDIAL MENISECTOMY CHONDROPLASTY PATELLA  AND MEDIAL CONDYLE;  Surgeon: Garald Balding, MD;  Location: [facility];  Service: Orthopedics;  Laterality: Left;  . TONSILLECTOMY     Social History   Occupational History  . Not on file  Tobacco Use  . Smoking status: Never Smoker  . Smokeless tobacco: Never Used  Substance and Sexual Activity  . Alcohol use: Not on file    Comment: rarely  . Drug use: No  . Sexual activity: Not on file

## 2020-05-06 NOTE — Telephone Encounter (Signed)
Please precert for left knee visco injections. This is Dr.Whitfield's patient. Thanks! 

## 2020-05-06 NOTE — Telephone Encounter (Signed)
Noted  

## 2020-05-07 ENCOUNTER — Telehealth: Payer: Self-pay

## 2020-05-07 NOTE — Telephone Encounter (Signed)
Submitted VOB for Euflexxa series, left knee. 

## 2020-05-08 MED FILL — ROSUVASTATIN CALCIUM 5 MG T: 5 | 90 days supply | Qty: 90 | Fill #1

## 2020-05-08 MED FILL — OMEGA-3 ETHYL ESTERS 1 GM C: 1 | 30 days supply | Qty: 120 | Fill #1

## 2020-05-12 ENCOUNTER — Telehealth: Payer: Self-pay

## 2020-05-12 NOTE — Telephone Encounter (Signed)
Approved for Euflexxa series, left knee. Buy & Bill Covered at 100% of the allowable amount. Co-pay of $60.00 No PA required  Appt. 06/03/2020

## 2020-06-03 ENCOUNTER — Ambulatory Visit: Payer: 59 | Admitting: Orthopaedic Surgery

## 2020-06-03 ENCOUNTER — Encounter: Payer: Self-pay | Admitting: Orthopaedic Surgery

## 2020-06-03 ENCOUNTER — Other Ambulatory Visit: Payer: Self-pay

## 2020-06-03 DIAGNOSIS — M1712 Unilateral primary osteoarthritis, left knee: Secondary | ICD-10-CM | POA: Diagnosis not present

## 2020-06-03 MED ORDER — SODIUM HYALURONATE (VISCOSUP) 20 MG/2ML IX SOSY
20.0000 mg | PREFILLED_SYRINGE | INTRA_ARTICULAR | Status: AC | PRN
  Administered 2020-06-03: 20 mg via INTRA_ARTICULAR

## 2020-06-03 NOTE — Progress Notes (Signed)
Office Visit Note   Patient: Shannon Andersen           Date of Birth: 09-08-1956           MRN: [id number] Visit Date: 06/03/2020              Requested by: Prince Solian, MD [address] [place],  [place] [address] PCP: Prince Solian, MD   Assessment & Plan: Visit Diagnoses:  1. Primary osteoarthritis of left knee     Plan: First Euflexxa injection left knee. return weekly for the next 2 weeks to complete the series Follow-Up Instructions: Return in about 1 week (around 06/10/2020).   Orders:  No orders of the defined types were placed in this encounter.  No orders of the defined types were placed in this encounter.     Procedures: Large Joint Inj: L knee on 06/03/2020 9:46 AM Indications: pain and joint swelling Details: 25 G 1.5 in needle, anteromedial approach  Arthrogram: No  Medications: 20 mg Sodium Hyaluronate 20 MG/2ML Outcome: tolerated well, no immediate complications Procedure, treatment alternatives, risks and benefits explained, specific risks discussed. Consent was given by the patient. Immediately prior to procedure a time out was called to verify the correct patient, procedure, equipment, support staff and site/side marked as required. Patient was prepped and draped in the usual sterile fashion.       Clinical Data: No additional findings.   Subjective: Chief Complaint  Patient presents with  . Left Knee - Pain  [name] has been approved for Euflexxa injections in her left knee.  We will initiate the first injection today  HPI  Review of Systems   Objective: Vital Signs: There were no vitals taken for this visit.  Physical Exam  Ortho Exam mild discomfort along the medial compartment of the left knee.  No effusion.  Full extension and flexion over 100 degrees without instability.  The knee was not hot warm or red  Specialty Comments:  No specialty comments available.  Imaging: No results found.   PMFS History: Patient  Active Problem List   Diagnosis Date Noted  . Other meniscus derangements, posterior horn of medial meniscus, left knee 06/11/2019  . Primary osteoarthritis of left knee 06/11/2019  . Chondromalacia patellae, left knee 06/11/2019  . Chronic pain of left knee 05/23/2019  . Dislocation, shoulder, anterior 10/01/2013  . 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 Osteopenia   . Osteoporosis    STIFFNESS IN HANDS    History reviewed. No pertinent family history.  Past Surgical History:  Procedure Laterality Date  . BANKART REPAIR Right 10/01/2013   Procedure: Right Shoulder Arthroscopic Debridement with Labral Repair;  Surgeon: Garald Balding, MD;  Location: [place];  Service: Orthopedics;  Laterality: Right;  . BREAST SURGERY Left    BIOPSY-BENIGN  . COLONOSCOPY WITH PROPOFOL N/A 09/07/2015   Procedure: COLONOSCOPY WITH PROPOFOL;  Surgeon: Garlan Fair, MD;  Location: [facility];  Service: Endoscopy;  Laterality: N/A;  . KNEE ARTHROSCOPY WITH MEDIAL MENISECTOMY Left 06/11/2019   Procedure: LEFT KNEE ARTHROSCOPY WITH MEDIAL MENISECTOMY CHONDROPLASTY PATELLA  AND MEDIAL CONDYLE;  Surgeon: Garald Balding, MD;  Location: [facility];  Service: Orthopedics;  Laterality: Left;  . TONSILLECTOMY     Social History   Occupational History  . Not on file  Tobacco Use  . Smoking status: Never Smoker  . Smokeless tobacco: Never Used  Substance and Sexual Activity  . Alcohol  use: Not on file    Comment: rarely  . Drug use: No  . Sexual activity: Not on file     Valeria Batman, MD   Note - This record has been created using [employer].  Chart creation errors have been sought, but may not always  have been located. Such creation errors do not reflect on  the standard of medical care.

## 2020-06-11 ENCOUNTER — Other Ambulatory Visit: Payer: Self-pay

## 2020-06-11 ENCOUNTER — Telehealth: Payer: Self-pay

## 2020-06-11 ENCOUNTER — Encounter: Payer: Self-pay | Admitting: Orthopaedic Surgery

## 2020-06-11 ENCOUNTER — Ambulatory Visit: Payer: 59 | Admitting: Orthopaedic Surgery

## 2020-06-11 DIAGNOSIS — M1712 Unilateral primary osteoarthritis, left knee: Secondary | ICD-10-CM

## 2020-06-11 MED ORDER — SODIUM HYALURONATE (VISCOSUP) 20 MG/2ML IX SOSY
20.0000 mg | PREFILLED_SYRINGE | INTRA_ARTICULAR | Status: AC | PRN
  Administered 2020-06-11: 20 mg via INTRA_ARTICULAR

## 2020-06-11 MED ORDER — LIDOCAINE HCL 1 % IJ SOLN
2.0000 mL | INTRAMUSCULAR | Status: AC | PRN
  Administered 2020-06-11: 2 mL

## 2020-06-11 NOTE — Progress Notes (Signed)
   Office Visit Note   Patient: Shannon Andersen           Date of Birth: 25-Jun-1956           MRN: [id number] Visit Date: 06/11/2020              Requested by: Chilton Greathouse, MD [address] [place],  [place] [address] PCP: Chilton Greathouse, MD   Assessment & Plan: Visit Diagnoses:  1. Primary osteoarthritis of left knee     Plan: #1: Second Euflexxa injection was given without difficulty. #2: Follow back up in 1 week for her third Euflexxa injection to the left knee.  Follow-Up Instructions: Return in about 1 week (around 06/18/2020).   Orders:  No orders of the defined types were placed in this encounter.  No orders of the defined types were placed in this encounter.     Procedures: Large Joint Inj: L knee on 06/11/2020 2:47 PM Indications: pain and joint swelling Details: 25 G 1.5 in needle, anteromedial approach  Arthrogram: No  Medications: 2 mL lidocaine 1 %; 20 mg Sodium Hyaluronate 20 MG/2ML Outcome: tolerated well, no immediate complications Procedure, treatment alternatives, risks and benefits explained, specific risks discussed. Consent was given by the patient. Immediately prior to procedure a time out was called to verify the correct patient, procedure, equipment, support staff and site/side marked as required. Patient was prepped and draped in the usual sterile fashion.       Clinical Data: No additional findings.   Subjective: Chief Complaint  Patient presents with  . Left Knee - Pain    HPI  Review of Systems   Objective: Vital Signs: There were no vitals taken for this visit.  Physical Exam  Ortho Exam  Knee is benign without reactivity  Specialty Comments:  No specialty comments available.  Imaging: No results found.   PMFS History: Patient Active Problem List   Diagnosis Date Noted  . Other meniscus derangements, posterior horn of medial meniscus, left knee 06/11/2019  . Primary osteoarthritis of left knee 06/11/2019  .  Chondromalacia patellae, left knee 06/11/2019  . Chronic pain of left knee 05/23/2019  . Dislocation, shoulder, anterior 10/01/2013  . 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 Osteopenia   . Osteoporosis    STIFFNESS IN HANDS    History reviewed. No pertinent family history.  Past Surgical History:  Procedure Laterality Date  . BANKART REPAIR Right 10/01/2013   Procedure: Right Shoulder Arthroscopic Debridement with Labral Repair;  Surgeon: Valeria Batman, MD;  Location: [facility];  Service: Orthopedics;  Laterality: Right;  . BREAST SURGERY Left    BIOPSY-BENIGN  . COLONOSCOPY WITH PROPOFOL N/A 09/07/2015   Procedure: COLONOSCOPY WITH PROPOFOL;  Surgeon: Charolett Bumpers, MD;  Location: [facility];  Service: Endoscopy;  Laterality: N/A;  . KNEE ARTHROSCOPY WITH MEDIAL MENISECTOMY Left 06/11/2019   Procedure: LEFT KNEE ARTHROSCOPY WITH MEDIAL MENISECTOMY CHONDROPLASTY PATELLA  AND MEDIAL CONDYLE;  Surgeon: Valeria Batman, MD;  Location: [facility];  Service: Orthopedics;  Laterality: Left;  . TONSILLECTOMY     Social History   Occupational History  . Not on file  Tobacco Use  . Smoking status: Never Smoker  . Smokeless tobacco: Never Used  Substance and Sexual Activity  . Alcohol use: Not on file    Comment: rarely  . Drug use: No  . Sexual activity: Not on file

## 2020-06-11 NOTE — Telephone Encounter (Signed)
Patient called in wanting verification she is saying she is supposed to come in today @4 :00 for injec . Advised her I only see appt on 7/6 & 7/7 . Patient wants clarification of her appt

## 2020-06-11 NOTE — Telephone Encounter (Signed)
Appt scheduled

## 2020-06-16 ENCOUNTER — Ambulatory Visit: Payer: 59 | Admitting: Orthopaedic Surgery

## 2020-06-17 ENCOUNTER — Ambulatory Visit: Payer: 59 | Admitting: Orthopaedic Surgery

## 2020-06-17 ENCOUNTER — Other Ambulatory Visit: Payer: Self-pay

## 2020-06-17 ENCOUNTER — Encounter: Payer: Self-pay | Admitting: Orthopaedic Surgery

## 2020-06-17 VITALS — Ht 70.0 in | Wt 178.0 lb

## 2020-06-17 DIAGNOSIS — M1712 Unilateral primary osteoarthritis, left knee: Secondary | ICD-10-CM | POA: Diagnosis not present

## 2020-06-17 MED ORDER — LIDOCAINE HCL 1 % IJ SOLN
2.0000 mL | INTRAMUSCULAR | Status: AC | PRN
  Administered 2020-06-17: 2 mL

## 2020-06-17 MED ORDER — SODIUM HYALURONATE (VISCOSUP) 20 MG/2ML IX SOSY
20.0000 mg | PREFILLED_SYRINGE | INTRA_ARTICULAR | Status: AC | PRN
  Administered 2020-06-17: 20 mg via INTRA_ARTICULAR

## 2020-06-17 NOTE — Progress Notes (Signed)
Office Visit Note   Patient: Shannon Andersen           Date of Birth: Nov 13, 1956           MRN: [id number] Visit Date: 06/17/2020              Requested by: Chilton Greathouse, MD [address] [place],  [place] [address] PCP: Chilton Greathouse, MD   Assessment & Plan: Visit Diagnoses:  1. Primary osteoarthritis of left knee     Plan: Third Euflexxa injection left knee.  Return as needed.  Good relief with the prior to injections  Follow-Up Instructions: Return if symptoms worsen or fail to improve.   Orders:  Orders Placed This Encounter  Procedures  . Large Joint Inj: L knee   No orders of the defined types were placed in this encounter.     Procedures: Large Joint Inj: L knee on 06/17/2020 9:23 AM Indications: pain and joint swelling Details: 25 G 1.5 in needle, anteromedial approach  Arthrogram: No  Medications: 2 mL lidocaine 1 %; 20 mg Sodium Hyaluronate 20 MG/2ML Outcome: tolerated well, no immediate complications Procedure, treatment alternatives, risks and benefits explained, specific risks discussed. Consent was given by the patient. Immediately prior to procedure a time out was called to verify the correct patient, procedure, equipment, support staff and site/side marked as required. Patient was prepped and draped in the usual sterile fashion.       Clinical Data: No additional findings.   Subjective: Chief Complaint  Patient presents with  . Left Knee - Follow-up    Euflexxa started 06-03-2020  Patient presents today for the third and final Euflexxa injection in her left knee. She started the injections on 06-03-2020. She has noticed minimal improvement thus far and wishes to continue with the third injection.  HPI  Review of Systems   Objective: Vital Signs: Ht 5\' 10"  (1.778 m)   Wt 178 lb (80.7 kg)   BMI 25.54 kg/m   Physical Exam  Ortho Exam no effusion left knee.  Knee was not hot warm or red.  No instability.  Walk without a  limp  Specialty Comments:  No specialty comments available.  Imaging: No results found.   PMFS History: Patient Active Problem List   Diagnosis Date Noted  . Other meniscus derangements, posterior horn of medial meniscus, left knee 06/11/2019  . Primary osteoarthritis of left knee 06/11/2019  . Chondromalacia patellae, left knee 06/11/2019  . Chronic pain of left knee 05/23/2019  . Dislocation, shoulder, anterior 10/01/2013  . 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 Osteopenia   . Osteoporosis    STIFFNESS IN HANDS    History reviewed. No pertinent family history.  Past Surgical History:  Procedure Laterality Date  . BANKART REPAIR Right 10/01/2013   Procedure: Right Shoulder Arthroscopic Debridement with Labral Repair;  Surgeon: 10/03/2013, MD;  Location: [facility] OR;  Service: Orthopedics;  Laterality: Right;  . BREAST SURGERY Left    BIOPSY-BENIGN  . COLONOSCOPY WITH PROPOFOL N/A 09/07/2015   Procedure: COLONOSCOPY WITH PROPOFOL;  Surgeon: 09/09/2015, MD;  Location: [facility];  Service: Endoscopy;  Laterality: N/A;  . KNEE ARTHROSCOPY WITH MEDIAL MENISECTOMY Left 06/11/2019   Procedure: LEFT KNEE ARTHROSCOPY WITH MEDIAL MENISECTOMY CHONDROPLASTY PATELLA  AND MEDIAL CONDYLE;  Surgeon: 06/13/2019, MD;  Location: [facility];  Service: Orthopedics;  Laterality: Left;  . TONSILLECTOMY     Social History  Occupational History  . Not on file  Tobacco Use  . Smoking status: Never Smoker  . Smokeless tobacco: Never Used  Substance and Sexual Activity  . Alcohol use: Not on file    Comment: rarely  . Drug use: No  . Sexual activity: Not on file

## 2020-06-21 MED FILL — OMEGA-3 ETHYL ESTERS 1 GM C: 1 | 30 days supply | Qty: 120 | Fill #2

## 2020-06-23 ENCOUNTER — Other Ambulatory Visit (HOSPITAL_COMMUNITY): Payer: Self-pay | Admitting: Internal Medicine

## 2020-06-23 MED FILL — MELOXICAM 15 MG TABLET: 15 | 90 days supply | Qty: 90 | Fill #0

## 2020-07-01 ENCOUNTER — Ambulatory Visit: Payer: 59 | Admitting: Orthopaedic Surgery

## 2020-07-01 ENCOUNTER — Ambulatory Visit (INDEPENDENT_AMBULATORY_CARE_PROVIDER_SITE_OTHER): Payer: 59

## 2020-07-01 ENCOUNTER — Encounter: Payer: Self-pay | Admitting: Orthopaedic Surgery

## 2020-07-01 ENCOUNTER — Other Ambulatory Visit: Payer: Self-pay

## 2020-07-01 VITALS — Ht 70.0 in | Wt 178.0 lb

## 2020-07-01 DIAGNOSIS — M545 Low back pain, unspecified: Secondary | ICD-10-CM

## 2020-07-01 MED ORDER — METHYLPREDNISOLONE 4 MG PO TBPK: ORAL_TABLET | ORAL | 0 refills | Status: DC

## 2020-07-01 MED FILL — METHYLPREDNISOLONE 4 MG TBP: 4 | 6 days supply | Qty: 21 | Fill #0

## 2020-07-01 NOTE — Progress Notes (Signed)
Office Visit Note   Patient: Shannon Andersen           Date of Birth: Jan 06, 1956           MRN: [id number] Visit Date: 07/01/2020              Requested by: Chilton Greathouse, MD [address] [place],  [place] [address] PCP: Chilton Greathouse, MD   Assessment & Plan: Visit Diagnoses:  1. Acute right-sided low back pain, unspecified whether sciatica present   2. Acute right-sided low back pain without sciatica     Plan: Acute onset of low back pain and right buttock pain within the last several days.  No fever or chills.  Had been active working around the house.  X-rays demonstrate significant degenerative disc disease at the least two levels of lumbar spine.  There is no radiculopathy.  I suspect that she has had an exacerbation of her pre-existing arthritis and was try a Medrol Dosepak.  We will check her back next week and if no improvement obtain an MRI scan.  There is no evidence of an acute compression fracture or discitis.  Will hold any NSAIDs while she is on the Medrol  Follow-Up Instructions: Return in about 1 week (around 07/08/2020).   Orders:  Orders Placed This Encounter  Procedures  . XR Lumbar Spine 2-3 Views   Meds ordered this encounter  Medications  . methylPREDNISolone (MEDROL DOSEPAK) 4 MG TBPK tablet    Sig: Take as directed on package.    Dispense:  21 tablet    Refill:  0      Procedures: No procedures performed   Clinical Data: No additional findings.   Subjective: Chief Complaint  Patient presents with  . Lower Back - Pain  Patient presents today for lower back pain that started Saturday night and worse Sunday morning. No known injury. Her pain is located in her lower back. She has a sharp pain in her right buttock that occasionally radiates down her lateral leg. She has been taking Ibuprofen and occasionally Tramadol. She is able to sleep at night. A heating pad seems to help. No numbness, tingling, or weakness in her lower extremities.  Better  when she is lying down and off her feet.  The only referred pain is to her right buttock.  No numbness or tingling.  She had been hanging wallpaper and working in her yard a day or two before the onset of her pain.  No bowel or bladder changes  HPI  Review of Systems   Objective: Vital Signs: Ht 5\' 10"  (1.778 m)   Wt 178 lb (80.7 kg)   BMI 25.54 kg/m   Physical Exam Constitutional:      Appearance: She is well-developed.  Eyes:     Pupils: Pupils are equal, round, and reactive to light.  Pulmonary:     Effort: Pulmonary effort is normal.  Skin:    General: Skin is warm and dry.  Neurological:     Mental Status: She is alert and oriented to person, place, and time.  Psychiatric:        Behavior: Behavior normal.     Ortho Exam more comfortable lying down.  Straight leg raise negative.  Reflexes were symmetrical.  Painless range of motion of both hips.  No appreciable percussible tenderness of the lumbar spine or at the thoracolumbar junction.  No pain over the buttock or the sacroiliac joints.  Specialty Comments:  No specialty comments available.  Imaging: XR Lumbar Spine 2-3 Views  Result Date: 07/01/2020 Films lumbar spine obtained in two projections.  There is significant degenerative disc disease at L4-5 and L2-3.  L2-3 there is significant irregularity of the disc space and narrowing joint.  There is some straightening of the normal lordosis.  No obvious listhesis.  No acute changes or evidence of compression fracture    PMFS History: Patient Active Problem List   Diagnosis Date Noted  . Low back pain 07/01/2020  . Other meniscus derangements, posterior horn of medial meniscus, left knee 06/11/2019  . Primary osteoarthritis of left knee 06/11/2019  . Chondromalacia patellae, left knee 06/11/2019  . Chronic pain of left knee 05/23/2019  . Dislocation, shoulder, anterior 10/01/2013  . 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  Osteopenia   . Osteoporosis    STIFFNESS IN HANDS    History reviewed. No pertinent family history.  Past Surgical History:  Procedure Laterality Date  . BANKART REPAIR Right 10/01/2013   Procedure: Right Shoulder Arthroscopic Debridement with Labral Repair;  Surgeon: Valeria Batman, MD;  Location: [facility] OR;  Service: Orthopedics;  Laterality: Right;  . BREAST SURGERY Left    BIOPSY-BENIGN  . COLONOSCOPY WITH PROPOFOL N/A 09/07/2015   Procedure: COLONOSCOPY WITH PROPOFOL;  Surgeon: Charolett Bumpers, MD;  Location: [facility];  Service: Endoscopy;  Laterality: N/A;  . KNEE ARTHROSCOPY WITH MEDIAL MENISECTOMY Left 06/11/2019   Procedure: LEFT KNEE ARTHROSCOPY WITH MEDIAL MENISECTOMY CHONDROPLASTY PATELLA  AND MEDIAL CONDYLE;  Surgeon: Valeria Batman, MD;  Location: [facility];  Service: Orthopedics;  Laterality: Left;  . TONSILLECTOMY     Social History   Occupational History  . Not on file  Tobacco Use  . Smoking status: Never Smoker  . Smokeless tobacco: Never Used  Substance and Sexual Activity  . Alcohol use: Not on file    Comment: rarely  . Drug use: No  . Sexual activity: Not on file

## 2020-07-09 ENCOUNTER — Other Ambulatory Visit: Payer: Self-pay

## 2020-07-09 ENCOUNTER — Ambulatory Visit: Payer: 59 | Admitting: Orthopaedic Surgery

## 2020-07-09 ENCOUNTER — Encounter: Payer: Self-pay | Admitting: Orthopaedic Surgery

## 2020-07-09 DIAGNOSIS — M545 Low back pain, unspecified: Secondary | ICD-10-CM

## 2020-07-09 NOTE — Progress Notes (Signed)
Office Visit Note   Patient: Shannon Andersen           Date of Birth: January 24, 1956           MRN: [id number] Visit Date: 07/09/2020              Requested by: Chilton Greathouse, MD [address] [place],  [place] [address] PCP: Chilton Greathouse, MD   Assessment & Plan: Visit Diagnoses:  1. Acute right-sided low back pain without sciatica     Plan: Maurisa relates that she is significantly better and having little if any back pain.  Still denies any radiculopathy.  No bowel or bladder changes.  Given her films and propensity for recurrent episodes of back pain I think it be worth setting up a course of physical therapy.  We will set this up at [facility] facility at [facility] and plan to see her back as needed.  Hold on the MRI scan until potentially needed in the future  Follow-Up Instructions: Return if symptoms worsen or fail to improve.   Orders:  No orders of the defined types were placed in this encounter.  No orders of the defined types were placed in this encounter.     Procedures: No procedures performed   Clinical Data: No additional findings.   Subjective: Chief Complaint  Patient presents with  . Lower Back - Pain  Low back pain is significantly improved to the point where there is no functional limitation.  Still denies any bowel or bladder changes or any radiculopathy.  Able to get up and walk without any discomfort or use of any ambulatory aid.  Sleeping without a problem.  HPI awake alert and oriented x3.  Comfortable sitting.  Able to get up very quickly from a sitting position and walk without any pain.  No discomfort with twisting flexion or extension of the lumbar spine.  Straight leg raise negative.  Neurologically intact.  No percussible tenderness of the lumbar spine  Review of Systems   Objective: Vital Signs: There were no vitals taken for this visit.  Physical Exam Constitutional:      Appearance: She is well-developed.  Eyes:     Pupils:  Pupils are equal, round, and reactive to light.  Pulmonary:     Effort: Pulmonary effort is normal.  Skin:    General: Skin is warm and dry.  Neurological:     Mental Status: She is alert and oriented to person, place, and time.  Psychiatric:        Behavior: Behavior normal.     Ortho Exam  Specialty Comments:  No specialty comments available.  Imaging: No results found.   PMFS History: Patient Active Problem List   Diagnosis Date Noted  . Low back pain 07/01/2020  . Other meniscus derangements, posterior horn of medial meniscus, left knee 06/11/2019  . Primary osteoarthritis of left knee 06/11/2019  . Chondromalacia patellae, left knee 06/11/2019  . Chronic pain of left knee 05/23/2019  . Dislocation, shoulder, anterior 10/01/2013  . 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 Osteopenia   . Osteoporosis    STIFFNESS IN HANDS    History reviewed. No pertinent family history.  Past Surgical History:  Procedure Laterality Date  . BANKART REPAIR Right 10/01/2013   Procedure: Right Shoulder Arthroscopic Debridement with Labral Repair;  Surgeon: Valeria Batman, MD;  Location: [facility] OR;  Service: Orthopedics;  Laterality: Right;  . BREAST SURGERY Left  BIOPSY-BENIGN  . COLONOSCOPY WITH PROPOFOL N/A 09/07/2015   Procedure: COLONOSCOPY WITH PROPOFOL;  Surgeon: Charolett Bumpers, MD;  Location: [facility];  Service: Endoscopy;  Laterality: N/A;  . KNEE ARTHROSCOPY WITH MEDIAL MENISECTOMY Left 06/11/2019   Procedure: LEFT KNEE ARTHROSCOPY WITH MEDIAL MENISECTOMY CHONDROPLASTY PATELLA  AND MEDIAL CONDYLE;  Surgeon: Valeria Batman, MD;  Location: [facility];  Service: Orthopedics;  Laterality: Left;  . TONSILLECTOMY     Social History   Occupational History  . Not on file  Tobacco Use  . Smoking status: Never Smoker  . Smokeless tobacco: Never Used  Substance and Sexual Activity  . Alcohol use: Not on file    Comment: rarely  . Drug  use: No  . Sexual activity: Not on file     Valeria Batman, MD   Note - This record has been created using [employer].  Chart creation errors have been sought, but may not always  have been located. Such creation errors do not reflect on  the standard of medical care.

## 2020-07-15 ENCOUNTER — Other Ambulatory Visit: Payer: Self-pay

## 2020-07-15 ENCOUNTER — Ambulatory Visit: Payer: 59 | Attending: Orthopaedic Surgery | Admitting: Physical Therapy

## 2020-07-15 ENCOUNTER — Encounter: Payer: Self-pay | Admitting: Physical Therapy

## 2020-07-15 DIAGNOSIS — M25562 Pain in left knee: Secondary | ICD-10-CM | POA: Diagnosis not present

## 2020-07-15 DIAGNOSIS — M545 Low back pain, unspecified: Secondary | ICD-10-CM

## 2020-07-15 DIAGNOSIS — G8929 Other chronic pain: Secondary | ICD-10-CM | POA: Diagnosis not present

## 2020-07-15 NOTE — Patient Instructions (Addendum)
Access Code: [email or web address] URL: [email or web address]: 08/04/2021Prepared by: Venetia Night BeuhringExercises  Supine Posterior Pelvic Tilt - 1 x daily - 7 x weekly - 1 sets - 10 reps - 3 hold  Supine Lower Trunk Rotation - 1 x daily - 7 x weekly - 1 sets - 10 reps - 10 hold  Hooklying Single Knee to Chest Stretch - 1 x daily - 7 x weekly - 1 sets - 3 reps - 10 hold  Supine Double Knee to Chest Modified - 1 x daily - 7 x weekly - 1 sets - 3 reps - 10 hold  Supine Figure 4 Piriformis Stretch - 1 x daily - 7 x weekly - 1 sets - 1 reps - 30 hold  Supine Piriformis Stretch with Foot on Ground - 1 x daily - 7 x weekly - 1 sets - 1 reps - 30 hold  Bridge - 1 x daily - 7 x weekly - 1 sets - 10 reps - 2 hold  Sidelying Thoracic Rotation with Open Book - 1 x daily - 7 x weekly - 1 sets - 10 reps  Cat-Camel - 1 x daily - 7 x weekly - 1 sets - 10 reps  Child's Pose Stretch - 1 x daily - 7 x weekly - 1 sets - 3 reps - 10 hold  Child's Pose with Sidebending - 1 x daily - 7 x weekly - 1 sets - 3 reps - 10 hold  Seated Hamstring Stretch - 1 x daily - 7 x weekly - 1 sets - 1 reps - 30 hold  Standing Hip Flexor Stretch - 1 x daily - 7 x weekly - 1 sets - 1 reps - 30 hold      [facility] Welcome to [place]! Here you will find all the information you will need regarding your pool therapy. If you have further questions at any time, please call our office at [phone]. After completing your initial evaluation in the [place] clinic, you may be eligible to complete a portion of your therapy in the pool. A typical week of therapy will consist of 1-2 typical physical therapy visits at our [place] location and an additional session of therapy in the pool located at the [facility] [place], [place], [place] [address].  Aquatic therapy will be offered on Friday afternoons. Each session will last approximately 45 minutes. All  scheduling and payments for aquatic therapy sessions, including cancelations, will be done through our [place] location.  To be eligible for aquatic therapy, these criteria must be met: . You must be able to independently change in the locker room and get to the pool deck. A caregiver can come with you to help if needed. There are bleachers for a caregiver to sit on next to the pool. . No one with an open wound is permitted in the pool.  Handicap parking is available in the front and there is a drop off option for even closer accessibility. Please arrive 15 minutes prior to your appointment to prepare for your pool session. You must sign in at the front desk upon your arrival. Please be sure to attend to any toileting needs prior to entering the pool. [place] rooms for changing are located to the right of the check-in desk. There is direct access to the pool deck from the locker room. You can lock your belongings in a locker but must bring a lock. Your therapist will greet you on  the pool deck. There may be other swimmers in the pool at the same time but your session is one-on-one with the therapist.

## 2020-07-15 NOTE — Therapy (Signed)
[facility] [address] [place], [place], [address] Phone: [phone]   Fax:  [phone]  Physical Therapy Evaluation  Patient Details  Name: MAHINA SALATINO MRN: [id number] Date of Birth: 1956-03-11 Referring Provider (PT): Valeria Batman, MD   Encounter Date: 07/15/2020   PT End of Session - 07/15/20 1026    Visit Number 1    Number of Visits 2    Date for PT Re-Evaluation 09/09/20    Authorization Type [place] [employer]    PT Start Time 0930    PT Stop Time 1015    PT Time Calculation (min) 45 min    Activity Tolerance Patient tolerated treatment well;No increased pain    Behavior During Therapy WFL for tasks assessed/performed           Past Medical History:  Diagnosis Date   Osteopenia    Osteoporosis    STIFFNESS IN HANDS    Past Surgical History:  Procedure Laterality Date   BANKART REPAIR Right 10/01/2013   Procedure: Right Shoulder Arthroscopic Debridement with Labral Repair;  Surgeon: Valeria Batman, MD;  Location: [facility] OR;  Service: Orthopedics;  Laterality: Right;   BREAST SURGERY Left    BIOPSY-BENIGN   COLONOSCOPY WITH PROPOFOL N/A 09/07/2015   Procedure: COLONOSCOPY WITH PROPOFOL;  Surgeon: Charolett Bumpers, MD;  Location: [facility];  Service: Endoscopy;  Laterality: N/A;   KNEE ARTHROSCOPY WITH MEDIAL MENISECTOMY Left 06/11/2019   Procedure: LEFT KNEE ARTHROSCOPY WITH MEDIAL MENISECTOMY CHONDROPLASTY PATELLA  AND MEDIAL CONDYLE;  Surgeon: Valeria Batman, MD;  Location: [facility];  Service: Orthopedics;  Laterality: Left;   TONSILLECTOMY      There were no vitals filed for this visit.    Subjective Assessment - 07/15/20 0932    Subjective Pt with acute onset of LBP and Rt buttock pain approx 2.5 weeks ago which has improved with Medrol dose pack.  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  Pain is now resolved but  stiffness is present.  Pt also has Lt knee arthritis (bone on bone) which affects how she moves and causes her to hurt her back.    Pertinent History Lt knee arthritis and meniscus tear July 2020 - repair 2020, has had gel injections x 3    How long can you walk comfortably? limited by Lt knee    Diagnostic tests xrays lumbar - arthritis    Patient Stated Goals 1 PT session goal with HEP    Currently in Pain? No/denies              [facility] PT Assessment - 07/15/20 0001      Assessment   Medical Diagnosis M54.5 (ICD-10-CM) - Acute right-sided low back pain without sciatica    Referring Provider (PT) Valeria Batman, MD    Onset Date/Surgical Date --   2.5 weeks ago   Next MD Visit as needed    Prior Therapy not for back      Precautions   Precautions None      Restrictions   Weight Bearing Restrictions No      Balance Screen   Has the patient fallen in the past 6 months No      Home Environment   Living Environment Private residence    Living Arrangements Spouse/significant other    Type of Home House    Home Access Stairs to enter  Entrance Stairs-Number of Steps 4    Home Layout Multi-level      Prior Function   Level of Independence Independent    Vocation Full time employment    Radiation protection practitioner      Cognition   Overall Cognitive Status Within Functional Limits for tasks assessed      Observation/Other Assessments   Focus on Therapeutic Outcomes (FOTO)  deferred due to only 1-2 visits for HEP      Sensation   Light Touch Appears Intact      Functional Tests   Functional tests Squat      Squat   Comments more WB through Rt LE to avoid Lt knee flexion secondary to pain      Posture/Postural Control   Posture/Postural Control Postural limitations    Postural Limitations Increased thoracic kyphosis;Decreased lumbar lordosis      ROM / Strength   AROM / PROM / Strength AROM;Strength;PROM      AROM   Overall AROM Comments trunk WNL with  stiffness end range all planes      PROM   Overall PROM Comments bil hip ER, flexion and ext end range stiffness without pain      Strength   Overall Strength Comments grossly 5/5 throughout bil LEs and trunk      Flexibility   Soft Tissue Assessment /Muscle Length yes   hamstrings, hip flexors, piriformis, gluteals limited 20-30%     Palpation   Spinal mobility limited lumbar segmental gapping L4-S1 > L2-L4    SI assessment  WNL    Palpation comment non-tender throughout      Special Tests    Special Tests Hip Special Tests    Hip Special Tests  Hip Scouring;Anterior Hip Impingement Test      Hip Scouring   Findings Negative    Comments bil      Anterior Hip Impingement Test    Findings Negative    Comments bil                      Objective measurements completed on examination: See above findings.       [facility] Adult PT Treatment/Exercise - 07/15/20 0001      Self-Care   Self-Care Other Self-Care Comments    Other Self-Care Comments  daily HEP developed, squatting in stagger stance for Lt knee care, bending, kneeling techniques for gardening to avoid straight leg prolonged trunk flexion she is currently doing, benefits of aquatic therapy and exercise                  PT Education - 07/15/20 1015    Education Details Access Code: [email or web address]    Person(s) Educated Patient    Methods Explanation;Demonstration;Handout    Comprehension Verbalized understanding;Returned demonstration               PT Long Term Goals - 07/15/20 1033      PT LONG TERM GOAL #1   Title Pt will be educated on body mechanics for bending, squatting, kneeing for gardening to avoid prolonged spinal bending.    Time 1    Period Weeks    Status Achieved    Target Date 07/22/20      PT LONG TERM GOAL #2   Title Pt will learn a HEP for daily use to improve flexibility and mobility of spine, hips, pelvis and LEs.    Time 1    Period Weeks    Status Achieved  Target Date 07/22/20      PT LONG TERM GOAL #3   Title Pt will attend 1 aquatic PT session to learn options for aerobic exercise and exercise for Lt knee pain/ROM/strength in presence of chronic arthritis related pain.    Time 8    Period Weeks    Status New    Target Date 09/09/20                  Plan - 07/15/20 1027    Clinical Impression Statement Pt is referred to PT to develop HEP to avoid future injury to low back.  Pt is a full time ICU nurse.  She experienced acute onset of LBP following wallpaper stripping and a long work day which spread into Rt buttock. Pain resolved with Medrol dose pack.  She has ongoing chronic Lt knee pain secondary to arthritis.  She presents with spinal stiffness, limited end range hip and trunk ROM, LE flexibility restrictions and improper body mechanics for bending/gardening.  Strength is 5/5 throughout.  PT developed HEP to include LE stretching, spine ROM, body mechanics training and core (bridges) to perform daily to improve overall mobility and reduce likelihood of future injury.  PT also discussed benefits of aquatic exercise so Pt will attend 1 aquatic session to learn options for water-based exercsie.  Pt should do well moving forward with ongoing compliance with HEP.    Personal Factors and Comorbidities Profession;Comorbidity 1    Comorbidities full time ICU nurse, Lt knee arthritis    Examination-Activity Limitations Squat    Examination-Participation Restrictions Yard Work    Stability/Clinical Decision Making Stable/Uncomplicated    Clinical Decision Making Low    Rehab Potential Excellent    PT Frequency 1x / week    PT Duration 8 weeks   one aquatic session in September, then d/c PT   PT Treatment/Interventions Aquatic Therapy;Therapeutic exercise    PT Next Visit Plan Pt will attend 1x aquatic PT to learn options for Lt knee and hip strengthening in water    PT Home Exercise Plan Access Code: [email or web address], aquatic info    Consulted and  Agree with Plan of Care Patient           Patient will benefit from skilled therapeutic intervention in order to improve the following deficits and impairments:  Decreased range of motion, Decreased mobility, Impaired flexibility, Improper body mechanics, Hypomobility  Visit Diagnosis: Chronic midline low back pain without sciatica - Plan: PT plan of care cert/re-cert  Chronic pain of left knee - Plan: PT plan of care cert/re-cert     Problem List Patient Active Problem List   Diagnosis Date Noted   Low back pain 07/01/2020   Other meniscus derangements, posterior horn of medial meniscus, left knee 06/11/2019   Primary osteoarthritis of left knee 06/11/2019   Chondromalacia patellae, left knee 06/11/2019   Chronic pain of left knee 05/23/2019   Dislocation, shoulder, anterior 10/01/2013   Glenoid labral tear 10/01/2013    Brayen Bunn, PT 07/15/20 10:40 AM   [facility] [address] [place], [place], [address] Phone: [phone]   Fax:  [phone]  Name: SHAUNTELLE JAMERSON MRN: [id number] Date of Birth: 27-Oct-1956

## 2020-07-31 MED FILL — OMEGA-3-ACID ETHYL ESTERS 1: 1 | 30 days supply | Qty: 120 | Fill #3

## 2020-08-18 ENCOUNTER — Other Ambulatory Visit (HOSPITAL_COMMUNITY): Payer: Self-pay | Admitting: Internal Medicine

## 2020-08-18 MED FILL — ROSUVASTATIN CALCIUM 5 MG T: 5 | 90 days supply | Qty: 90 | Fill #2

## 2020-08-18 MED FILL — VIT D2 1.25 MG (50,000 UNIT: 1.25 MG | 84 days supply | Qty: 36 | Fill #0

## 2020-08-26 MED FILL — VENLAFAXINE HCL ER 150 MG C: 150 | 90 days supply | Qty: 90 | Fill #0

## 2020-08-28 ENCOUNTER — Other Ambulatory Visit: Payer: Self-pay

## 2020-08-28 ENCOUNTER — Ambulatory Visit: Payer: 59 | Attending: Orthopaedic Surgery | Admitting: Physical Therapy

## 2020-08-28 ENCOUNTER — Encounter: Payer: Self-pay | Admitting: Physical Therapy

## 2020-08-28 DIAGNOSIS — M545 Low back pain, unspecified: Secondary | ICD-10-CM

## 2020-08-28 DIAGNOSIS — M25562 Pain in left knee: Secondary | ICD-10-CM | POA: Insufficient documentation

## 2020-08-28 DIAGNOSIS — G8929 Other chronic pain: Secondary | ICD-10-CM | POA: Diagnosis not present

## 2020-08-28 NOTE — Therapy (Addendum)
[facility] [address], [place], [place], [address] Phone: [phone]   Fax:  [phone]  Physical Therapy Treatment  Patient Details  Name: Shannon Andersen MRN: [id number] Date of Birth: 04-20-1956 Referring Provider (PT): Garald Balding, MD   Encounter Date: 08/28/2020   PT End of Session - 08/28/20 1602    Visit Number 2    Date for PT Re-Evaluation 09/09/20    Authorization Type [place] [employer]    PT Start Time 1220    PT Stop Time 1300    PT Time Calculation (min) 40 min    Activity Tolerance Patient tolerated treatment well;No increased pain    Behavior During Therapy WFL for tasks assessed/performed           Past Medical History:  Diagnosis Date  . Osteopenia   . Osteoporosis    STIFFNESS IN HANDS    Past Surgical History:  Procedure Laterality Date  . BANKART REPAIR Right 10/01/2013   Procedure: Right Shoulder Arthroscopic Debridement with Labral Repair;  Surgeon: Garald Balding, MD;  Location: [facility];  Service: Orthopedics;  Laterality: Right;  . BREAST SURGERY Left    BIOPSY-BENIGN  . COLONOSCOPY WITH PROPOFOL N/A 09/07/2015   Procedure: COLONOSCOPY WITH PROPOFOL;  Surgeon: Garlan Fair, MD;  Location: [facility];  Service: Endoscopy;  Laterality: N/A;  . KNEE ARTHROSCOPY WITH MEDIAL MENISECTOMY Left 06/11/2019   Procedure: LEFT KNEE ARTHROSCOPY WITH MEDIAL MENISECTOMY CHONDROPLASTY PATELLA  AND MEDIAL CONDYLE;  Surgeon: Garald Balding, MD;  Location: [facility];  Service: Orthopedics;  Laterality: Left;  . TONSILLECTOMY      There were no vitals filed for this visit.   Subjective Assessment - 08/28/20 1600    Subjective I cannot walk around the park with my husband because my knee will hurt too much. Do not have any back pain right now.    Pertinent History Lt knee arthritis and meniscus tear July 2020 - repair 2020, has had gel injections x 3    Multiple Pain Sites No            Patient seen for aquatic therapy today.  Treatment took place in water 2.5-4 feet deep depending upon activity.  Pt entered the pool via stairs.  Pt verbally educated in water principles she can use going forward for knee ROM & strength, core strength, hip ROM and strength and back strength. Pt verbally understood all principles and how to implement them.  Water exercises to include water walking in 4 directions, standing core compressions with medium noodle, high knee marching, knee flex/extension, underwater bicycle & decompression float.Pt demonstrated all exercises with proper form and no pain.                                 PT Long Term Goals - 08/28/20 1606      PT LONG TERM GOAL #1   Title Pt will be educated on body mechanics for bending, squatting, kneeing for gardening to avoid prolonged spinal bending.    Time 1    Period Weeks    Status Achieved      PT LONG TERM GOAL #2   Title Pt will learn a HEP for daily use to improve flexibility and mobility of spine, hips, pelvis and LEs.    Time 1    Period Weeks    Status Achieved  PT LONG TERM GOAL #3   Title Pt will attend 1 aquatic PT session to learn options for aerobic exercise and exercise for Lt knee pain/ROM/strength in presence of chronic arthritis related pain.    Time 8    Period Weeks    Status Achieved                 Plan - 08/28/20 1602    Clinical Impression Statement Pt arrives today for aquatic PT session to learn exercises she can implement into her own aquatic program either at the [facility] or [facility]. Pt was educated in both water principles and exercises for both knee ROM & strength and lumbar/hip ROM and strength. Pt had no pain performing these exercises and was able to perform at faster speeds that generate current.    Personal Factors and Comorbidities Profession;Comorbidity 1    Comorbidities full time ICU nurse, Lt knee arthritis    Examination-Activity Limitations  Squat    Examination-Participation Restrictions Yard Work    Stability/Clinical Decision Making Stable/Uncomplicated    PT Frequency 1x / week    PT Duration 8 weeks    PT Treatment/Interventions Aquatic Therapy;Therapeutic exercise    PT Next Visit Plan D/C to HEP    PT Home Exercise Plan Access Code: [email or web address], aquatic info    Consulted and Agree with Plan of Care Patient           Patient will benefit from skilled therapeutic intervention in order to improve the following deficits and impairments:  Decreased range of motion, Decreased mobility, Impaired flexibility, Improper body mechanics, Hypomobility  Visit Diagnosis: Chronic midline low back pain without sciatica  Chronic pain of left knee     Problem List Patient Active Problem List   Diagnosis Date Noted  . Low back pain 07/01/2020  . Other meniscus derangements, posterior horn of medial meniscus, left knee 06/11/2019  . Primary osteoarthritis of left knee 06/11/2019  . Chondromalacia patellae, left knee 06/11/2019  . Chronic pain of left knee 05/23/2019  . Dislocation, shoulder, anterior 10/01/2013  . Glenoid labral tear 10/01/2013    Myrene Galas, PTA 08/28/20 4:13 PM   PHYSICAL THERAPY DISCHARGE SUMMARY  Visits from Start of Care: 2  Current functional level related to goals / functional outcomes: See above   Remaining deficits: See above   Education / Equipment: HEP for land and aquatic environments Plan: Patient agrees to discharge.  Patient goals were met. Patient is being discharged due to meeting the stated rehab goals.  ?????        Baruch Merl, PT 08/30/20 4:15 PM   [facility] [address], [place], [place], [address] Phone: [phone]   Fax:  [phone]  Name: Shannon Andersen MRN: [id number] Date of Birth: January 12, 1956

## 2020-09-09 DIAGNOSIS — H53021 Refractive amblyopia, right eye: Secondary | ICD-10-CM | POA: Diagnosis not present

## 2020-09-09 DIAGNOSIS — H5203 Hypermetropia, bilateral: Secondary | ICD-10-CM | POA: Diagnosis not present

## 2020-09-09 DIAGNOSIS — H2513 Age-related nuclear cataract, bilateral: Secondary | ICD-10-CM | POA: Diagnosis not present

## 2020-09-10 ENCOUNTER — Other Ambulatory Visit (HOSPITAL_COMMUNITY): Payer: Self-pay | Admitting: Internal Medicine

## 2020-09-10 MED FILL — OMEGA-3-ACID ETHYL ESTERS 1: 1 | 30 days supply | Qty: 120 | Fill #4

## 2020-09-14 DIAGNOSIS — Z6825 Body mass index (BMI) 25.0-25.9, adult: Secondary | ICD-10-CM | POA: Diagnosis not present

## 2020-09-14 DIAGNOSIS — Z01419 Encounter for gynecological examination (general) (routine) without abnormal findings: Secondary | ICD-10-CM | POA: Diagnosis not present

## 2020-09-14 MED FILL — SHINGRIX 50 MCG SUS: 50 | 1 days supply | Qty: 1 | Fill #0

## 2020-09-14 MED FILL — PNEUMOVAX 23 SYRINGE: 25 | 1 days supply | Qty: 1 | Fill #0

## 2020-09-18 DIAGNOSIS — Z1231 Encounter for screening mammogram for malignant neoplasm of breast: Secondary | ICD-10-CM | POA: Diagnosis not present

## 2020-09-28 MED FILL — MELOXICAM 15 MG TABLET: 15 | 90 days supply | Qty: 90 | Fill #1

## 2020-10-20 MED FILL — OMEGA-3-ACID ETHYL ESTERS 1: 1 | 30 days supply | Qty: 120 | Fill #5

## 2020-10-22 ENCOUNTER — Ambulatory Visit: Payer: 59 | Admitting: Orthopaedic Surgery

## 2020-10-22 ENCOUNTER — Other Ambulatory Visit: Payer: Self-pay

## 2020-10-22 ENCOUNTER — Encounter: Payer: Self-pay | Admitting: Orthopaedic Surgery

## 2020-10-22 DIAGNOSIS — M1712 Unilateral primary osteoarthritis, left knee: Secondary | ICD-10-CM

## 2020-10-22 NOTE — Progress Notes (Signed)
Office Visit Note   Patient: Shannon Andersen           Date of Birth: Apr 26, 1956           MRN: [id number] Visit Date: 10/22/2020              Requested by: Chilton Greathouse, MD [address] [place],  [place] [address] PCP: Chilton Greathouse, MD   Assessment & Plan: Visit Diagnoses:  1. Primary osteoarthritis of left knee     Plan: Mrs. Camuso and her husband came to the office today to discuss options for her arthritic left knee.  After much discussion she like to proceed with total knee replacement.  I discussed the surgery, incision, outpatient versus inpatient and what she may expect postoperatively with the knee for a walker for several weeks and physical therapy.  She like to proceed.  We will have her return to the office for history and physical and for further in-depth discussion but week to 2 before surgery and give her clearance forms for Dr. Felipa Eth.  Follow-Up Instructions: Return We will schedule left total knee replacement.   Orders:  No orders of the defined types were placed in this encounter.  No orders of the defined types were placed in this encounter.     Procedures: No procedures performed   Clinical Data: No additional findings.   Subjective: Chief Complaint  Patient presents with  . Left Knee - Pain  Ms. Calia continues to have significant compromise of her activities related to her left knee osteoarthritis.  She just completed a course of Euflexxa in July and notes that it helped "a little" but she still has difficulty with activities of daily living.  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  She like to proceed with knee replacement and wants to discuss that today Prior films earlier in this year demonstrate advanced osteoarthritis predominantly in the medial compartment where there was near bone-on-bone, increased varus associated with subchondral  sclerosis and subchondral cysts HPI  Review of Systems   Objective: Vital Signs: There were no vitals taken for this visit.  Physical Exam Constitutional:      Appearance: She is well-developed.  Eyes:     Pupils: Pupils are equal, round, and reactive to light.  Pulmonary:     Effort: Pulmonary effort is normal.  Skin:    General: Skin is warm and dry.  Neurological:     Mental Status: She is alert and oriented to person, place, and time.  Psychiatric:        Behavior: Behavior normal.     Ortho Exam awake alert and oriented x3.  Comfortable sitting.  Full extension left knee with increased varus.  Flexed about 120 degrees.  No instability.  Palpable osteophytes along the medial compartment with mostly medial joint pain no distal edema.  Neurologically intact.  Straight leg raise negative.  Painless range of motion left hip  Specialty Comments:  No specialty comments available.  Imaging: No results found.   PMFS History: Patient Active Problem List   Diagnosis Date Noted  . Low back pain 07/01/2020  . Other meniscus derangements, posterior horn of medial meniscus, left knee 06/11/2019  . Primary osteoarthritis of left knee 06/11/2019  . Chondromalacia patellae, left knee 06/11/2019  . Chronic pain of left knee 05/23/2019  . Dislocation, shoulder, anterior 10/01/2013  .  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 Osteopenia   . Osteoporosis    STIFFNESS IN HANDS    History reviewed. No pertinent family history.  Past Surgical History:  Procedure Laterality Date  . BANKART REPAIR Right 10/01/2013   Procedure: Right Shoulder Arthroscopic Debridement with Labral Repair;  Surgeon: Valeria Batman, MD;  Location: [facility] OR;  Service: Orthopedics;  Laterality: Right;  . BREAST SURGERY Left    BIOPSY-BENIGN  . COLONOSCOPY WITH PROPOFOL N/A 09/07/2015   Procedure: COLONOSCOPY WITH PROPOFOL;  Surgeon: Charolett Bumpers, MD;  Location: [facility];  Service: Endoscopy;  Laterality: N/A;  . KNEE ARTHROSCOPY WITH MEDIAL MENISECTOMY Left 06/11/2019   Procedure: LEFT KNEE ARTHROSCOPY WITH MEDIAL MENISECTOMY CHONDROPLASTY PATELLA  AND MEDIAL CONDYLE;  Surgeon: Valeria Batman, MD;  Location: [facility];  Service: Orthopedics;  Laterality: Left;  . TONSILLECTOMY     Social History   Occupational History  . Not on file  Tobacco Use  . Smoking status: Never Smoker  . Smokeless tobacco: Never Used  Substance and Sexual Activity  . Alcohol use: Not on file    Comment: rarely  . Drug use: No  . Sexual activity: Not on file     Valeria Batman, MD   Note - This record has been created using [employer].  Chart creation errors have been sought, but may not always  have been located. Such creation errors do not reflect on  the standard of medical care.

## 2020-10-29 ENCOUNTER — Ambulatory Visit: Payer: 59 | Admitting: Orthopaedic Surgery

## 2020-11-03 DIAGNOSIS — E559 Vitamin D deficiency, unspecified: Secondary | ICD-10-CM | POA: Diagnosis not present

## 2020-11-03 DIAGNOSIS — G8929 Other chronic pain: Secondary | ICD-10-CM | POA: Diagnosis not present

## 2020-11-03 DIAGNOSIS — E785 Hyperlipidemia, unspecified: Secondary | ICD-10-CM | POA: Diagnosis not present

## 2020-11-03 DIAGNOSIS — Z01818 Encounter for other preprocedural examination: Secondary | ICD-10-CM | POA: Diagnosis not present

## 2020-11-03 DIAGNOSIS — M25562 Pain in left knee: Secondary | ICD-10-CM | POA: Diagnosis not present

## 2020-11-10 ENCOUNTER — Other Ambulatory Visit: Payer: Self-pay

## 2020-11-11 ENCOUNTER — Other Ambulatory Visit: Payer: Self-pay

## 2020-11-17 ENCOUNTER — Other Ambulatory Visit: Payer: Self-pay

## 2020-11-17 ENCOUNTER — Ambulatory Visit (INDEPENDENT_AMBULATORY_CARE_PROVIDER_SITE_OTHER): Payer: 59 | Admitting: Orthopaedic Surgery

## 2020-11-17 ENCOUNTER — Encounter: Payer: Self-pay | Admitting: Orthopaedic Surgery

## 2020-11-17 VITALS — BP 133/74 | HR 76 | Ht 70.0 in | Wt 168.0 lb

## 2020-11-17 DIAGNOSIS — M1712 Unilateral primary osteoarthritis, left knee: Secondary | ICD-10-CM | POA: Diagnosis not present

## 2020-11-17 NOTE — Progress Notes (Signed)
Office Visit Note   Patient: Shannon Andersen           Date of Birth: [birth year]           MRN: [id number] Visit Date: 11/17/2020              Requested by: Chilton Greathouse, MD [address] [place],  [place] [address] PCP: Chilton Greathouse, MD   Chief Complaint:left knee pain.  HPI: Shannon Andersen, 64 y.o. female, has a history of pain and functional disability in the left knee due to arthritis and has failed non-surgical conservative treatments for greater than 12 weeks to includeNSAID's and/or analgesics, corticosteriod injections, viscosupplementation injections, flexibility and strengthening excercises, weight reduction as appropriate and activity modification.  Onset of symptoms was gradual, starting 2 years ago with gradually worsening course since that time. The patient noted prior procedures on the knee to include  arthroscopy and menisectomy on the left knee(s).  Patient currently rates pain in the left knee(s) at 7 out of 10 with activity. Patient has night pain, worsening of pain with activity and weight bearing, pain that interferes with activities of daily living, pain with passive range of motion, crepitus and joint swelling.  Patient has evidence of subchondral sclerosis, periarticular osteophytes and joint space narrowing by imaging studies.   There is no active infection.  Patient Active Problem List   Diagnosis Date Noted  . Low back pain 07/01/2020  . Other meniscus derangements, posterior horn of medial meniscus, left knee 06/11/2019  . Primary osteoarthritis of left knee 06/11/2019  . Chondromalacia patellae, left knee 06/11/2019  . Chronic pain of left knee 05/23/2019  . Dislocation, shoulder, anterior 10/01/2013  . 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 Osteopenia   . Osteoporosis    STIFFNESS IN HANDS    Past Surgical History:  Procedure Laterality Date  . BANKART REPAIR Right 10/01/2013   Procedure: Right Shoulder  Arthroscopic Debridement with Labral Repair;  Surgeon: Valeria Batman, MD;  [facility];  Service: Orthopedics;  Laterality: Right;  . BREAST SURGERY Left    BIOPSY-BENIGN  . COLONOSCOPY WITH PROPOFOL N/A 09/07/2015   Procedure: COLONOSCOPY WITH PROPOFOL;  Surgeon: Charolett Bumpers, MD;  Location: [facility];  Service: Endoscopy;  Laterality: N/A;  . KNEE ARTHROSCOPY WITH MEDIAL MENISECTOMY Left 06/11/2019   Procedure: LEFT KNEE ARTHROSCOPY WITH MEDIAL MENISECTOMY CHONDROPLASTY PATELLA  AND MEDIAL CONDYLE;  Surgeon: Valeria Batman, MD;  Location: [facility];  Service: Orthopedics;  Laterality: Left;  . TONSILLECTOMY      No current facility-administered medications for this encounter.   Current Outpatient Medications  Medication Sig Dispense Refill Last Dose  . meloxicam (MOBIC) 15 MG tablet Take 15 mg by mouth daily.     . Menthol, Topical Analgesic, (BIOFREEZE EX) Apply 1 application topically daily as needed (joint pain).     . methylPREDNISolone (MEDROL DOSEPAK) 4 MG TBPK tablet Take as directed on package. 21 tablet 0   . Omega-3 1000 MG CAPS Take 2,000 mg by mouth daily.      . rosuvastatin (CRESTOR) 5 MG tablet Take 5 mg by mouth daily.      Marland Kitchen venlafaxine XR (EFFEXOR-XR) 150 MG 24 hr capsule Take 150 mg by mouth daily.   4   . Vitamin D, Ergocalciferol, (DRISDOL) 50000 UNITS CAPS capsule Take 50,000 Units by mouth every Monday, Wednesday, and Friday.       Allergies  Allergen Reactions  .  Sulfa Antibiotics     Chills, fever, almost like a sunburn   . Vascepa [Icosapent Ethyl]     Social History   Tobacco Use  . Smoking status: Never Smoker  . Smokeless tobacco: Never Used  Substance Use Topics  . Alcohol use: Not on file    Comment: rarely    No family history on file.     Objective:  Physical Exam Constitutional:      Appearance: Normal appearance. She is normal weight.  HENT:     Head: Normocephalic.     Nose: Nose normal.     Mouth/Throat:     Mouth:  Mucous membranes are moist.     Pharynx: Oropharynx is clear.  Eyes:     Extraocular Movements: Extraocular movements intact.     Pupils: Pupils are equal, round, and reactive to light.  Cardiovascular:     Rate and Rhythm: Normal rate and regular rhythm.     Pulses: Normal pulses.     Heart sounds: Normal heart sounds.  Pulmonary:     Effort: Pulmonary effort is normal.     Breath sounds: Normal breath sounds.  Abdominal:     General: Abdomen is flat. Bowel sounds are normal.     Palpations: Abdomen is soft.  Skin:    General: Skin is warm and dry.  Neurological:     General: No focal deficit present.     Mental Status: She is alert and oriented to person, place, and time.  Psychiatric:        Mood and Affect: Mood normal.        Behavior: Behavior normal.        Thought Content: Thought content normal.        Judgment: Judgment normal.     Vital signs in last 24 hours: BP: 133/74  pULSE 76 HEIGHT: 5\' 10"  WEIGHT: 168  Labs:   Estimated body mass index is 25.54 kg/m as calculated from the following:   Height as of 07/01/20: 5\' 10"  (1.778 m).   Weight as of 07/01/20: 80.7 kg.   Imaging Review Plain radiographs demonstrate moderate degenerative joint disease of the left knee(s). The overall alignment ismild varus. The bone quality appears to be good for age and reported activity level.      Assessment/Plan:  End stage arthritis, left knee   The patient history, physical examination, clinical judgment of the provider and imaging studies are consistent with end stage degenerative joint disease of the left knee(s) and total knee arthroplasty is deemed medically necessary. The treatment options including medical management, injection therapy arthroscopy and arthroplasty were discussed at length. The risks and benefits of total knee arthroplasty were presented and reviewed. The risks due to aseptic loosening, infection, stiffness, patella tracking problems, thromboembolic  complications and other imponderables were discussed. The patient acknowledged the explanation, agreed to proceed with the plan and consent was signed. Patient is being admitted for inpatient treatment for surgery, pain control, PT, OT, prophylactic antibiotics, VTE prophylaxis, progressive ambulation and ADL's and discharge planning. The patient is planning to be discharged home with home health services     Patient's anticipated LOS is less than 2 midnights, meeting these requirements: - Younger than 13 - Lives within 1 hour of care - Has a competent adult at home to recover with post-op recover - NO history of  - Chronic pain requiring opiods  - Diabetes  - Coronary Artery Disease  - Heart failure  - Heart attack  -  Stroke  - DVT/VTE  - Cardiac arrhythmia  - Respiratory Failure/COPD  - Renal failure  - Anemia  - Advanced Liver disease  Oris Drone. Daphine Deutscher [phone]  11/17/2020 3:30 PM

## 2020-11-17 NOTE — H&P (Signed)
TOTAL KNEE ADMISSION H&P  Patient is being admitted for left total knee arthroplasty.  Subjective:  Chief Complaint:left knee pain.  HPI: Shannon Andersen, 64 y.o. female, has a history of pain and functional disability in the left knee due to arthritis and has failed non-surgical conservative treatments for greater than 12 weeks to includeNSAID's and/or analgesics, corticosteriod injections, viscosupplementation injections, flexibility and strengthening excercises, weight reduction as appropriate and activity modification.  Onset of symptoms was gradual, starting 2 years ago with gradually worsening course since that time. The patient noted prior procedures on the knee to include  arthroscopy and menisectomy on the left knee(s).  Patient currently rates pain in the left knee(s) at 7 out of 10 with activity. Patient has night pain, worsening of pain with activity and weight bearing, pain that interferes with activities of daily living, pain with passive range of motion, crepitus and joint swelling.  Patient has evidence of subchondral sclerosis, periarticular osteophytes and joint space narrowing by imaging studies.   There is no active infection.  Patient Active Problem List   Diagnosis Date Noted  . Low back pain 07/01/2020  . Other meniscus derangements, posterior horn of medial meniscus, left knee 06/11/2019  . Primary osteoarthritis of left knee 06/11/2019  . Chondromalacia patellae, left knee 06/11/2019  . Chronic pain of left knee 05/23/2019  . Dislocation, shoulder, anterior 10/01/2013  . 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 Osteopenia   . Osteoporosis    STIFFNESS IN HANDS    Past Surgical History:  Procedure Laterality Date  . BANKART REPAIR Right 10/01/2013   Procedure: Right Shoulder Arthroscopic Debridement with Labral Repair;  Surgeon: Valeria Batman, MD;  Location: [facility];  Service: Orthopedics;  Laterality: Right;  . BREAST SURGERY  Left    BIOPSY-BENIGN  . COLONOSCOPY WITH PROPOFOL N/A 09/07/2015   Procedure: COLONOSCOPY WITH PROPOFOL;  Surgeon: Charolett Bumpers, MD;  Location: [facility];  Service: Endoscopy;  Laterality: N/A;  . KNEE ARTHROSCOPY WITH MEDIAL MENISECTOMY Left 06/11/2019   Procedure: LEFT KNEE ARTHROSCOPY WITH MEDIAL MENISECTOMY CHONDROPLASTY PATELLA  AND MEDIAL CONDYLE;  Surgeon: Valeria Batman, MD;  Location: [facility];  Service: Orthopedics;  Laterality: Left;  . TONSILLECTOMY      No current facility-administered medications for this encounter.   Current Outpatient Medications  Medication Sig Dispense Refill Last Dose  . meloxicam (MOBIC) 15 MG tablet Take 15 mg by mouth daily.     . Menthol, Topical Analgesic, (BIOFREEZE EX) Apply 1 application topically daily as needed (joint pain).     . methylPREDNISolone (MEDROL DOSEPAK) 4 MG TBPK tablet Take as directed on package. 21 tablet 0   . Omega-3 1000 MG CAPS Take 2,000 mg by mouth daily.      . rosuvastatin (CRESTOR) 5 MG tablet Take 5 mg by mouth daily.      Marland Kitchen venlafaxine XR (EFFEXOR-XR) 150 MG 24 hr capsule Take 150 mg by mouth daily.   4   . Vitamin D, Ergocalciferol, (DRISDOL) 50000 UNITS CAPS capsule Take 50,000 Units by mouth every Monday, Wednesday, and Friday.       Allergies  Allergen Reactions  . Sulfa Antibiotics     Chills, fever, almost like a sunburn   . Vascepa [Icosapent Ethyl]     Social History   Tobacco Use  . Smoking status: Never Smoker  . Smokeless tobacco: Never Used  Substance Use Topics  . Alcohol use: Not on file  Comment: rarely    No family history on file.     Objective:  Physical Exam Constitutional:      Appearance: Normal appearance. She is normal weight.  HENT:     Head: Normocephalic.     Nose: Nose normal.     Mouth/Throat:     Mouth: Mucous membranes are moist.     Pharynx: Oropharynx is clear.  Eyes:     Extraocular Movements: Extraocular movements intact.     Pupils: Pupils are  equal, round, and reactive to light.  Cardiovascular:     Rate and Rhythm: Normal rate and regular rhythm.     Pulses: Normal pulses.     Heart sounds: Normal heart sounds.  Pulmonary:     Effort: Pulmonary effort is normal.     Breath sounds: Normal breath sounds.  Abdominal:     General: Abdomen is flat. Bowel sounds are normal.     Palpations: Abdomen is soft.  Skin:    General: Skin is warm and dry.  Neurological:     General: No focal deficit present.     Mental Status: She is alert and oriented to person, place, and time.  Psychiatric:        Mood and Affect: Mood normal.        Behavior: Behavior normal.        Thought Content: Thought content normal.        Judgment: Judgment normal.     Vital signs in last 24 hours: BP: 133/74  pULSE 76 HEIGHT: 5\' 10"  WEIGHT: 168  Labs:   Estimated body mass index is 25.54 kg/m as calculated from the following:   Height as of 07/01/20: 5\' 10"  (1.778 m).   Weight as of 07/01/20: 80.7 kg.   Imaging Review Plain radiographs demonstrate moderate degenerative joint disease of the left knee(s). The overall alignment ismild varus. The bone quality appears to be good for age and reported activity level.      Assessment/Plan:  End stage arthritis, left knee   The patient history, physical examination, clinical judgment of the provider and imaging studies are consistent with end stage degenerative joint disease of the left knee(s) and total knee arthroplasty is deemed medically necessary. The treatment options including medical management, injection therapy arthroscopy and arthroplasty were discussed at length. The risks and benefits of total knee arthroplasty were presented and reviewed. The risks due to aseptic loosening, infection, stiffness, patella tracking problems, thromboembolic complications and other imponderables were discussed. The patient acknowledged the explanation, agreed to proceed with the plan and consent was signed.  Patient is being admitted for inpatient treatment for surgery, pain control, PT, OT, prophylactic antibiotics, VTE prophylaxis, progressive ambulation and ADL's and discharge planning. The patient is planning to be discharged home with home health services     Patient's anticipated LOS is less than 2 midnights, meeting these requirements: - Younger than 47 - Lives within 1 hour of care - Has a competent adult at home to recover with post-op recover - NO history of  - Chronic pain requiring opiods  - Diabetes  - Coronary Artery Disease  - Heart failure  - Heart attack  - Stroke  - DVT/VTE  - Cardiac arrhythmia  - Respiratory Failure/COPD  - Renal failure  - Anemia  - Advanced Liver disease  07/03/20. 76 Oris Drone  11/17/2020 3:30 PM

## 2020-11-19 MED FILL — ROSUVASTATIN CALCIUM 5 MG T: 5 | 90 days supply | Qty: 90 | Fill #3

## 2020-11-19 MED FILL — VIT D2 1.25 MG (50,000 UNIT: 1.25 MG | 84 days supply | Qty: 36 | Fill #1

## 2020-11-19 NOTE — Patient Instructions (Addendum)
DUE TO COVID-19 ONLY ONE VISITOR IS ALLOWED TO COME WITH YOU AND STAY IN THE WAITING ROOM ONLY DURING PRE OP AND PROCEDURE DAY OF SURGERY. THE 1 VISITOR  MAY VISIT WITH YOU AFTER SURGERY IN YOUR PRIVATE ROOM DURING VISITING HOURS ONLY!  YOU NEED TO HAVE A COVID 19 TEST ON: 11/20/20 @ 11:00 AM , THIS TEST MUST BE DONE BEFORE SURGERY,  COVID TESTING SITE [address] [place] [address], IT IS ON THE RIGHT GOING OUT [address] APPROXIMATELY  2 MINUTES PAST [employer] ON THE RIGHT. ONCE YOUR COVID TEST IS COMPLETED,  PLEASE BEGIN THE QUARANTINE INSTRUCTIONS AS OUTLINED IN YOUR HANDOUT.                Shannon Andersen    Your procedure is scheduled on: 11/24/20   Report to [facility] Main  Entrance   Report to admitting at : 7:30 AM     Call this number if you have problems the morning of surgery [phone]    Remember:   NO SOLID FOOD AFTER MIDNIGHT THE NIGHT PRIOR TO SURGERY. NOTHING BY MOUTH EXCEPT CLEAR LIQUIDS UNTIL: 7:00 AM . PLEASE FINISH ENSURE DRINK PER SURGEON ORDER  WHICH NEEDS TO BE COMPLETED AT : 7:00 AM.  CLEAR LIQUID DIET   Foods Allowed                                                                     Foods Excluded  Coffee and tea, regular and decaf                             liquids that you cannot  Plain Jell-O any favor except red or purple                                           see through such as: Fruit ices (not with fruit pulp)                                     milk, soups, orange juice  Iced Popsicles                                    All solid food Carbonated beverages, regular and diet                                    Cranberry, grape and apple juices Sports drinks like Gatorade Lightly seasoned clear broth or consume(fat free) Sugar, honey syrup  Sample Menu Breakfast                                Lunch  Supper Cranberry juice                    Beef broth                             Chicken broth Jell-O                                     Grape juice                           Apple juice Coffee or tea                        Jell-O                                      Popsicle                                                Coffee or tea                        Coffee or tea  _____________________________________________________________________   BRUSH YOUR TEETH MORNING OF SURGERY AND RINSE YOUR MOUTH OUT, NO CHEWING GUM CANDY OR MINTS.                              You may not have any metal on your body including hair pins and              piercings  Do not wear jewelry, make-up, lotions, powders or perfumes, deodorant             Do not wear nail polish on your fingernails.  Do not shave  48 hours prior to surgery.    Do not bring valuables to the hospital. [facility] IS NOT             RESPONSIBLE   FOR VALUABLES.  Contacts, dentures or bridgework may not be worn into surgery.  Leave suitcase in the car. After surgery it may be brought to your room.     Patients discharged the day of surgery will not be allowed to drive home. IF YOU ARE HAVING SURGERY AND GOING HOME THE SAME DAY, YOU MUST HAVE AN ADULT TO DRIVE YOU HOME AND BE WITH YOU FOR 24 HOURS. YOU MAY GO HOME BY TAXI OR UBER OR ORTHERWISE, BUT AN ADULT MUST ACCOMPANY YOU HOME AND STAY WITH YOU FOR 24 HOURS.  Name and phone number of your driver:  Special Instructions: N/A              Please read over the following fact sheets you were given: _____________________________________________________________________          [facility] - Preparing for Surgery Before surgery, you can play an important role.  Because skin is not sterile, your skin needs to be as free of germs as possible.  You can reduce the number of germs on your skin by washing with CHG (chlorahexidine gluconate) soap before  surgery.  CHG is an antiseptic cleaner which kills germs and bonds with the skin to continue killing germs even  after washing. Please DO NOT use if you have an allergy to CHG or antibacterial soaps.  If your skin becomes reddened/irritated stop using the CHG and inform your nurse when you arrive at Short Stay. Do not shave (including legs and underarms) for at least 48 hours prior to the first CHG shower.  You may shave your face/neck. Please follow these instructions carefully:  1.  Shower with CHG Soap the night before surgery and the  morning of Surgery.  2.  If you choose to wash your hair, wash your hair first as usual with your  normal  shampoo.  3.  After you shampoo, rinse your hair and body thoroughly to remove the  shampoo.                           4.  Use CHG as you would any other liquid soap.  You can apply chg directly  to the skin and wash                       Gently with a scrungie or clean washcloth.  5.  Apply the CHG Soap to your body ONLY FROM THE NECK DOWN.   Do not use on face/ open                           Wound or open sores. Avoid contact with eyes, ears mouth and genitals (private parts).                       Wash face,  Genitals (private parts) with your normal soap.             6.  Wash thoroughly, paying special attention to the area where your surgery  will be performed.  7.  Thoroughly rinse your body with warm water from the neck down.  8.  DO NOT shower/wash with your normal soap after using and rinsing off  the CHG Soap.                9.  Pat yourself dry with a clean towel.            10.  Wear clean pajamas.            11.  Place clean sheets on your bed the night of your first shower and do not  sleep with pets. Day of Surgery : Do not apply any lotions/deodorants the morning of surgery.  Please wear clean clothes to the hospital/surgery center.  FAILURE TO FOLLOW THESE INSTRUCTIONS MAY RESULT IN THE CANCELLATION OF YOUR SURGERY PATIENT SIGNATURE_________________________________  NURSE  SIGNATURE__________________________________  ________________________________________________________________________   Shannon Andersen  An incentive spirometer is a tool that can help keep your lungs clear and active. This tool measures how well you are filling your lungs with each breath. Taking long deep breaths may help reverse or decrease the chance of developing breathing (pulmonary) problems (especially infection) following:  A long period of time when you are unable to move or be active. BEFORE THE PROCEDURE   If the spirometer includes an indicator to show your best effort, your nurse or respiratory therapist will set it to a desired goal.  If possible, sit up straight or lean slightly forward. Try not  to slouch.  Hold the incentive spirometer in an upright position. INSTRUCTIONS FOR USE  1. Sit on the edge of your bed if possible, or sit up as far as you can in bed or on a chair. 2. Hold the incentive spirometer in an upright position. 3. Breathe out normally. 4. Place the mouthpiece in your mouth and seal your lips tightly around it. 5. Breathe in slowly and as deeply as possible, raising the piston or the ball toward the top of the column. 6. Hold your breath for 3-5 seconds or for as long as possible. Allow the piston or ball to fall to the bottom of the column. 7. Remove the mouthpiece from your mouth and breathe out normally. 8. Rest for a few seconds and repeat Steps 1 through 7 at least 10 times every 1-2 hours when you are awake. Take your time and take a few normal breaths between deep breaths. 9. The spirometer may include an indicator to show your best effort. Use the indicator as a goal to work toward during each repetition. 10. After each set of 10 deep breaths, practice coughing to be sure your lungs are clear. If you have an incision (the cut made at the time of surgery), support your incision when coughing by placing a pillow or rolled up towels firmly  against it. Once you are able to get out of bed, walk around indoors and cough well. You may stop using the incentive spirometer when instructed by your caregiver.  RISKS AND COMPLICATIONS  Take your time so you do not get dizzy or light-headed.  If you are in pain, you may need to take or ask for pain medication before doing incentive spirometry. It is harder to take a deep breath if you are having pain. AFTER USE  Rest and breathe slowly and easily.  It can be helpful to keep track of a log of your progress. Your caregiver can provide you with a simple table to help with this. If you are using the spirometer at home, follow these instructions: [place] IF:   You are having difficultly using the spirometer.  You have trouble using the spirometer as often as instructed.  Your pain medication is not giving enough relief while using the spirometer.  You develop fever of 100.5 F (38.1 C) or higher. SEEK IMMEDIATE MEDICAL CARE IF:   You cough up bloody sputum that had not been present before.  You develop fever of 102 F (38.9 C) or greater.  You develop worsening pain at or near the incision site. MAKE SURE YOU:   Understand these instructions.  Will watch your condition.  Will get help right away if you are not doing well or get worse. Document Released: 04/10/2007 Document Revised: 02/20/2012 Document Reviewed: 06/11/2007 [facility] Patient Information 2014 [place], [place].   ________________________________________________________________________

## 2020-11-20 ENCOUNTER — Encounter (HOSPITAL_COMMUNITY): Payer: Self-pay

## 2020-11-20 ENCOUNTER — Ambulatory Visit (HOSPITAL_COMMUNITY)
Admission: RE | Admit: 2020-11-20 | Discharge: 2020-11-20 | Disposition: A | Discharge status: Home / Self Care | Payer: 59 | Source: Ambulatory Visit | Attending: Orthopedic Surgery | Admitting: Orthopedic Surgery

## 2020-11-20 ENCOUNTER — Other Ambulatory Visit (HOSPITAL_COMMUNITY)
Admission: RE | Admit: 2020-11-20 | Discharge: 2020-11-20 | Disposition: A | Discharge status: Home / Self Care | Payer: 59 | Source: Ambulatory Visit

## 2020-11-20 ENCOUNTER — Other Ambulatory Visit: Payer: Self-pay

## 2020-11-20 ENCOUNTER — Encounter (HOSPITAL_COMMUNITY)
Admission: RE | Admit: 2020-11-20 | Discharge: 2020-11-20 | Disposition: A | Discharge status: Home / Self Care | Payer: 59 | Source: Ambulatory Visit | Attending: Orthopaedic Surgery | Admitting: Orthopaedic Surgery

## 2020-11-20 DIAGNOSIS — Z01818 Encounter for other preprocedural examination: Secondary | ICD-10-CM | POA: Insufficient documentation

## 2020-11-20 DIAGNOSIS — Z20822 Contact with and (suspected) exposure to covid-19: Secondary | ICD-10-CM | POA: Insufficient documentation

## 2020-11-20 HISTORY — DX: Unspecified osteoarthritis, unspecified site: M19.90

## 2020-11-20 LAB — CBC WITH DIFFERENTIAL/PLATELET
Abs Immature Granulocytes: 0.03 10*3/uL (ref 0.00–0.07)
Basophils Absolute: 0.1 10*3/uL (ref 0.0–0.1)
Basophils Relative: 1 %
Eosinophils Absolute: 0.2 10*3/uL (ref 0.0–0.5)
Eosinophils Relative: 2 %
HCT: 43.6 % (ref 36.0–46.0)
Hemoglobin: 14.2 g/dL (ref 12.0–15.0)
Immature Granulocytes: 0 %
Lymphocytes Relative: 25 %
Lymphs Abs: 2.1 10*3/uL (ref 0.7–4.0)
MCH: 29.2 pg (ref 26.0–34.0)
MCHC: 32.6 g/dL (ref 30.0–36.0)
MCV: 89.5 fL (ref 80.0–100.0)
Monocytes Absolute: 0.9 10*3/uL (ref 0.1–1.0)
Monocytes Relative: 11 %
Neutro Abs: 5.1 10*3/uL (ref 1.7–7.7)
Neutrophils Relative %: 61 %
Platelets: 182 10*3/uL (ref 150–400)
RBC: 4.87 MIL/uL (ref 3.87–5.11)
RDW: 13.1 % (ref 11.5–15.5)
WBC: 8.4 10*3/uL (ref 4.0–10.5)
nRBC: 0 % (ref 0.0–0.2)

## 2020-11-20 LAB — COMPREHENSIVE METABOLIC PANEL
ALT: 20 U/L (ref 0–44)
AST: 20 U/L (ref 15–41)
Albumin: 4.3 g/dL (ref 3.5–5.0)
Alkaline Phosphatase: 89 U/L (ref 38–126)
Anion gap: 5 (ref 5–15)
BUN: 27 mg/dL — ABNORMAL HIGH (ref 8–23)
CO2: 28 mmol/L (ref 22–32)
Calcium: 9.6 mg/dL (ref 8.9–10.3)
Chloride: 106 mmol/L (ref 98–111)
Creatinine, Ser: 0.79 mg/dL (ref 0.44–1.00)
GFR, Estimated: >60 mL/min (ref >60)
Glucose, Bld: 83 mg/dL (ref 70–99)
Potassium: 4.5 mmol/L (ref 3.5–5.1)
Sodium: 139 mmol/L (ref 135–145)
Total Bilirubin: 0.5 mg/dL (ref 0.3–1.2)
Total Protein: 7.4 g/dL (ref 6.5–8.1)

## 2020-11-20 LAB — URINALYSIS, ROUTINE W REFLEX MICROSCOPIC
Bilirubin Urine: NEGATIVE
Glucose, UA: NEGATIVE mg/dL
Hgb urine dipstick: NEGATIVE
Ketones, ur: NEGATIVE mg/dL
Leukocytes,Ua: NEGATIVE
Nitrite: NEGATIVE
Protein, ur: NEGATIVE mg/dL
Specific Gravity, Urine: 1.02 (ref 1.005–1.030)
pH: 7 (ref 5.0–8.0)

## 2020-11-20 LAB — SARS CORONAVIRUS 2 (TAT 6-24 HRS): SARS Coronavirus 2: NEGATIVE

## 2020-11-20 LAB — SURGICAL PCR SCREEN
MRSA, PCR: NEGATIVE
Staphylococcus aureus: NEGATIVE

## 2020-11-20 LAB — APTT: aPTT: 30 seconds (ref 24–36)

## 2020-11-20 LAB — PROTIME-INR
INR: 1 (ref 0.8–1.2)
Prothrombin Time: 13.2 seconds (ref 11.4–15.2)

## 2020-11-20 NOTE — Progress Notes (Signed)
COVID Vaccine Completed: Yes Date COVID Vaccine completed:09/28/20 COVID vaccine manufacturer: Dance movement psychotherapist  PCP - Dr. Chilton Greathouse Cardiologist -   Chest x-ray -  EKG -  Stress Test -  ECHO -  Cardiac Cath -  Pacemaker/ICD device last checked:  Sleep Study -  CPAP -   Fasting Blood Sugar -  Checks Blood Sugar _____ times a day  Blood Thinner Instructions: Aspirin Instructions: Last Dose:  Anesthesia review:   Patient denies shortness of breath, fever, cough and chest pain at PAT appointment   Patient verbalized understanding of instructions that were given to them at the PAT appointment. Patient was also instructed that they will need to review over the PAT instructions again at home before surgery.

## 2020-11-21 LAB — URINE CULTURE: Culture: <10000 — AB

## 2020-11-24 ENCOUNTER — Encounter (HOSPITAL_COMMUNITY): Payer: Self-pay | Admitting: Orthopaedic Surgery

## 2020-11-24 ENCOUNTER — Other Ambulatory Visit: Payer: Self-pay

## 2020-11-24 ENCOUNTER — Ambulatory Visit (HOSPITAL_COMMUNITY)
Admission: RE | Admit: 2020-11-24 | Discharge: 2020-11-24 | Disposition: A | Discharge status: Home / Self Care | Payer: 59 | Attending: Orthopaedic Surgery | Admitting: Orthopaedic Surgery

## 2020-11-24 ENCOUNTER — Encounter (HOSPITAL_COMMUNITY)
Admission: RE | Disposition: A | Discharge status: Home / Self Care | Payer: Self-pay | Source: Home / Self Care | Attending: Orthopaedic Surgery

## 2020-11-24 ENCOUNTER — Other Ambulatory Visit: Payer: Self-pay | Admitting: Orthopedic Surgery

## 2020-11-24 ENCOUNTER — Ambulatory Visit (HOSPITAL_COMMUNITY): Payer: 59 | Admitting: Anesthesiology

## 2020-11-24 ENCOUNTER — Telehealth: Payer: Self-pay | Admitting: Orthopaedic Surgery

## 2020-11-24 DIAGNOSIS — Z888 Allergy status to other drugs, medicaments and biological substances status: Secondary | ICD-10-CM | POA: Diagnosis not present

## 2020-11-24 DIAGNOSIS — Z882 Allergy status to sulfonamides status: Secondary | ICD-10-CM | POA: Diagnosis not present

## 2020-11-24 DIAGNOSIS — M1712 Unilateral primary osteoarthritis, left knee: Secondary | ICD-10-CM | POA: Insufficient documentation

## 2020-11-24 DIAGNOSIS — M25762 Osteophyte, left knee: Secondary | ICD-10-CM | POA: Insufficient documentation

## 2020-11-24 DIAGNOSIS — M2242 Chondromalacia patellae, left knee: Secondary | ICD-10-CM | POA: Diagnosis not present

## 2020-11-24 DIAGNOSIS — Z79899 Other long term (current) drug therapy: Secondary | ICD-10-CM | POA: Diagnosis not present

## 2020-11-24 DIAGNOSIS — Z96652 Presence of left artificial knee joint: Secondary | ICD-10-CM | POA: Diagnosis not present

## 2020-11-24 DIAGNOSIS — M25462 Effusion, left knee: Secondary | ICD-10-CM | POA: Diagnosis not present

## 2020-11-24 DIAGNOSIS — G8918 Other acute postprocedural pain: Secondary | ICD-10-CM | POA: Diagnosis not present

## 2020-11-24 DIAGNOSIS — Z791 Long term (current) use of non-steroidal anti-inflammatories (NSAID): Secondary | ICD-10-CM | POA: Insufficient documentation

## 2020-11-24 DIAGNOSIS — M65862 Other synovitis and tenosynovitis, left lower leg: Secondary | ICD-10-CM | POA: Insufficient documentation

## 2020-11-24 HISTORY — PX: TOTAL KNEE ARTHROPLASTY: SHX125

## 2020-11-24 LAB — TYPE AND SCREEN
ABO/RH(D): O POS
Antibody Screen: NEGATIVE

## 2020-11-24 LAB — ABO/RH: ABO/RH(D): O POS

## 2020-11-24 SURGERY — ARTHROPLASTY, KNEE, TOTAL
Anesthesia: Monitor Anesthesia Care | Site: Knee | Laterality: Left

## 2020-11-24 MED ORDER — CEFAZOLIN SODIUM-DEXTROSE 2-4 GM/100ML-% IV SOLN
2.0000 g | INTRAVENOUS | Status: AC
  Administered 2020-11-24: 2 g via INTRAVENOUS
  Filled 2020-11-24: qty 100

## 2020-11-24 MED ORDER — STERILE WATER FOR IRRIGATION IR SOLN
Status: DC | PRN
  Administered 2020-11-24: 2000 mL

## 2020-11-24 MED ORDER — EPHEDRINE SULFATE-NACL 50-0.9 MG/10ML-% IV SOSY
PREFILLED_SYRINGE | INTRAVENOUS | Status: DC | PRN
  Administered 2020-11-24: 5 mg via INTRAVENOUS
  Administered 2020-11-24: 10 mg via INTRAVENOUS
  Administered 2020-11-24: 5 mg via INTRAVENOUS
  Administered 2020-11-24 (×2): 10 mg via INTRAVENOUS
  Administered 2020-11-24 (×2): 5 mg via INTRAVENOUS

## 2020-11-24 MED ORDER — METHOCARBAMOL 500 MG PO TABS: 500.0000 mg | ORAL_TABLET | Freq: Three times a day (TID) | ORAL | 0 refills | Status: DC | PRN

## 2020-11-24 MED ORDER — ONDANSETRON HCL 4 MG/2ML IJ SOLN
INTRAMUSCULAR | Status: DC | PRN
  Administered 2020-11-24: 4 mg via INTRAVENOUS

## 2020-11-24 MED ORDER — METOCLOPRAMIDE HCL 5 MG/ML IJ SOLN: 5.0000 mg | Freq: Three times a day (TID) | INTRAMUSCULAR | Status: DC | PRN

## 2020-11-24 MED ORDER — OXYCODONE HCL 5 MG PO TABS
5.0000 mg | ORAL_TABLET | ORAL | Status: DC | PRN
  Administered 2020-11-24: 5 mg via ORAL

## 2020-11-24 MED ORDER — ONDANSETRON HCL 4 MG/2ML IJ SOLN
INTRAMUSCULAR | Status: AC
  Filled 2020-11-24: qty 2

## 2020-11-24 MED ORDER — ACETAMINOPHEN 325 MG PO TABS: 650.0000 mg | ORAL_TABLET | Freq: Four times a day (QID) | ORAL | Status: DC

## 2020-11-24 MED ORDER — ORAL CARE MOUTH RINSE: 15.0000 mL | Freq: Once | OROMUCOSAL | Status: AC

## 2020-11-24 MED ORDER — PROPOFOL 500 MG/50ML IV EMUL
INTRAVENOUS | Status: DC | PRN
  Administered 2020-11-24: 50 ug/kg/min via INTRAVENOUS

## 2020-11-24 MED ORDER — PHENYLEPHRINE 40 MCG/ML (10ML) SYRINGE FOR IV PUSH (FOR BLOOD PRESSURE SUPPORT)
PREFILLED_SYRINGE | INTRAVENOUS | Status: AC
  Filled 2020-11-24: qty 10

## 2020-11-24 MED ORDER — BUPIVACAINE-EPINEPHRINE 0.5% -1:200000 IJ SOLN
INTRAMUSCULAR | Status: DC | PRN
  Administered 2020-11-24: 30 mL

## 2020-11-24 MED ORDER — HYDROMORPHONE HCL 1 MG/ML IJ SOLN
0.2500 mg | INTRAMUSCULAR | Status: DC | PRN
Start: 2020-11-24 — End: 2020-11-24

## 2020-11-24 MED ORDER — DEXAMETHASONE SODIUM PHOSPHATE 10 MG/ML IJ SOLN
INTRAMUSCULAR | Status: DC | PRN
  Administered 2020-11-24: 5 mg

## 2020-11-24 MED ORDER — METHOCARBAMOL 500 MG IVPB - SIMPLE MED
INTRAVENOUS | Status: AC
  Filled 2020-11-24: qty 50

## 2020-11-24 MED ORDER — DEXAMETHASONE SODIUM PHOSPHATE 10 MG/ML IJ SOLN
INTRAMUSCULAR | Status: AC
  Filled 2020-11-24: qty 1

## 2020-11-24 MED ORDER — MIDAZOLAM HCL 2 MG/2ML IJ SOLN
INTRAMUSCULAR | Status: AC
  Filled 2020-11-24: qty 2

## 2020-11-24 MED ORDER — 0.9 % SODIUM CHLORIDE (POUR BTL) OPTIME
TOPICAL | Status: DC | PRN
  Administered 2020-11-24: 1000 mL

## 2020-11-24 MED ORDER — FENTANYL CITRATE (PF) 100 MCG/2ML IJ SOLN
INTRAMUSCULAR | Status: AC
  Filled 2020-11-24: qty 2

## 2020-11-24 MED ORDER — MIDAZOLAM HCL 2 MG/2ML IJ SOLN
1.0000 mg | Freq: Once | INTRAMUSCULAR | Status: AC
  Administered 2020-11-24: 1 mg via INTRAVENOUS
  Filled 2020-11-24: qty 2

## 2020-11-24 MED ORDER — POVIDONE-IODINE 10 % EX SWAB: 2.0000 "application " | Freq: Once | CUTANEOUS | Status: AC

## 2020-11-24 MED ORDER — LACTATED RINGERS IV BOLUS
500.0000 mL | Freq: Once | INTRAVENOUS | Status: AC
  Administered 2020-11-24: 500 mL via INTRAVENOUS

## 2020-11-24 MED ORDER — HYDROMORPHONE HCL 1 MG/ML IJ SOLN
0.5000 mg | INTRAMUSCULAR | Status: DC | PRN
  Administered 2020-11-24: 0.5 mg via INTRAVENOUS

## 2020-11-24 MED ORDER — OXYCODONE HCL 5 MG PO TABS
ORAL_TABLET | ORAL | Status: AC
  Filled 2020-11-24: qty 1

## 2020-11-24 MED ORDER — PHENYLEPHRINE 40 MCG/ML (10ML) SYRINGE FOR IV PUSH (FOR BLOOD PRESSURE SUPPORT)
PREFILLED_SYRINGE | INTRAVENOUS | Status: DC | PRN
  Administered 2020-11-24: 80 ug via INTRAVENOUS
  Administered 2020-11-24 (×4): 40 ug via INTRAVENOUS
  Administered 2020-11-24: 80 ug via INTRAVENOUS
  Administered 2020-11-24: 40 ug via INTRAVENOUS

## 2020-11-24 MED ORDER — ACETAMINOPHEN 10 MG/ML IV SOLN: 1000.0000 mg | Freq: Once | INTRAVENOUS | Status: DC | PRN

## 2020-11-24 MED ORDER — LIDOCAINE HCL (PF) 2 % IJ SOLN
INTRAMUSCULAR | Status: AC
  Filled 2020-11-24: qty 5

## 2020-11-24 MED ORDER — METOCLOPRAMIDE HCL 5 MG PO TABS
5.0000 mg | ORAL_TABLET | Freq: Three times a day (TID) | ORAL | Status: DC | PRN
  Filled 2020-11-24: qty 2

## 2020-11-24 MED ORDER — LACTATED RINGERS IV SOLN: INTRAVENOUS | Status: DC

## 2020-11-24 MED ORDER — BUPIVACAINE-EPINEPHRINE (PF) 0.5% -1:200000 IJ SOLN
INTRAMUSCULAR | Status: AC
  Filled 2020-11-24: qty 30

## 2020-11-24 MED ORDER — ROPIVACAINE HCL 7.5 MG/ML IJ SOLN
INTRAMUSCULAR | Status: DC | PRN
  Administered 2020-11-24: 20 mL via PERINEURAL

## 2020-11-24 MED ORDER — EPHEDRINE 5 MG/ML INJ
INTRAVENOUS | Status: AC
  Filled 2020-11-24: qty 10

## 2020-11-24 MED ORDER — FENTANYL CITRATE (PF) 100 MCG/2ML IJ SOLN
INTRAMUSCULAR | Status: DC | PRN
  Administered 2020-11-24: 50 ug via INTRAVENOUS

## 2020-11-24 MED ORDER — TRANEXAMIC ACID-NACL 1000-0.7 MG/100ML-% IV SOLN
1000.0000 mg | INTRAVENOUS | Status: AC
  Administered 2020-11-24: 1000 mg via INTRAVENOUS
  Filled 2020-11-24: qty 100

## 2020-11-24 MED ORDER — ACETAMINOPHEN 10 MG/ML IV SOLN
INTRAVENOUS | Status: AC
  Administered 2020-11-24: 1000 mg via INTRAVENOUS
  Filled 2020-11-24: qty 100

## 2020-11-24 MED ORDER — PROPOFOL 10 MG/ML IV BOLUS
INTRAVENOUS | Status: AC
  Filled 2020-11-24: qty 20

## 2020-11-24 MED ORDER — MIDAZOLAM HCL 5 MG/5ML IJ SOLN
INTRAMUSCULAR | Status: DC | PRN
  Administered 2020-11-24 (×2): 1 mg via INTRAVENOUS

## 2020-11-24 MED ORDER — DEXAMETHASONE SODIUM PHOSPHATE 4 MG/ML IJ SOLN
INTRAMUSCULAR | Status: DC | PRN
  Administered 2020-11-24: 4 mg via INTRAVENOUS

## 2020-11-24 MED ORDER — PROPOFOL 10 MG/ML IV BOLUS
INTRAVENOUS | Status: DC | PRN
  Administered 2020-11-24: 20 mg via INTRAVENOUS
  Administered 2020-11-24: 50 mg via INTRAVENOUS
  Administered 2020-11-24: 20 mg via INTRAVENOUS

## 2020-11-24 MED ORDER — CHLORHEXIDINE GLUCONATE 0.12 % MT SOLN
15.0000 mL | Freq: Once | OROMUCOSAL | Status: AC
  Administered 2020-11-24: 15 mL via OROMUCOSAL

## 2020-11-24 MED ORDER — SODIUM CHLORIDE 0.9 % IV SOLN: INTRAVENOUS | Status: DC

## 2020-11-24 MED ORDER — ONDANSETRON HCL 4 MG PO TABS
4.0000 mg | ORAL_TABLET | Freq: Four times a day (QID) | ORAL | Status: DC | PRN
  Filled 2020-11-24: qty 1

## 2020-11-24 MED ORDER — ONDANSETRON HCL 4 MG/2ML IJ SOLN: 4.0000 mg | Freq: Four times a day (QID) | INTRAMUSCULAR | Status: DC | PRN

## 2020-11-24 MED ORDER — CEFAZOLIN SODIUM-DEXTROSE 1-4 GM/50ML-% IV SOLN: 1.0000 g | Freq: Four times a day (QID) | INTRAVENOUS | Status: DC

## 2020-11-24 MED ORDER — BUPIVACAINE IN DEXTROSE 0.75-8.25 % IT SOLN
INTRATHECAL | Status: DC | PRN
  Administered 2020-11-24: 1.8 mL via INTRATHECAL

## 2020-11-24 MED ORDER — POVIDONE-IODINE 10 % EX SWAB
2.0000 "application " | Freq: Once | CUTANEOUS | Status: AC
  Administered 2020-11-24: 2 via TOPICAL

## 2020-11-24 MED ORDER — LACTATED RINGERS IV BOLUS
250.0000 mL | Freq: Once | INTRAVENOUS | Status: AC
  Administered 2020-11-24: 250 mL via INTRAVENOUS

## 2020-11-24 MED ORDER — PROMETHAZINE HCL 25 MG/ML IJ SOLN
6.2500 mg | INTRAMUSCULAR | Status: DC | PRN
Start: 2020-11-24 — End: 2020-11-24

## 2020-11-24 MED ORDER — METHOCARBAMOL 500 MG IVPB - SIMPLE MED
500.0000 mg | Freq: Four times a day (QID) | INTRAVENOUS | Status: DC | PRN
  Administered 2020-11-24: 500 mg via INTRAVENOUS

## 2020-11-24 MED ORDER — FENTANYL CITRATE (PF) 100 MCG/2ML IJ SOLN
50.0000 ug | Freq: Once | INTRAMUSCULAR | Status: AC
  Administered 2020-11-24: 50 ug via INTRAVENOUS
  Filled 2020-11-24: qty 2

## 2020-11-24 MED ORDER — OXYCODONE HCL 5 MG PO CAPS: 5.0000 mg | ORAL_CAPSULE | ORAL | 0 refills | Status: DC | PRN

## 2020-11-24 MED ORDER — HYDROMORPHONE HCL 1 MG/ML IJ SOLN
INTRAMUSCULAR | Status: AC
  Filled 2020-11-24: qty 1

## 2020-11-24 MED ORDER — SODIUM CHLORIDE 0.9 % IR SOLN
Status: DC | PRN
  Administered 2020-11-24: 1000 mL

## 2020-11-24 MED ORDER — METHOCARBAMOL 500 MG PO TABS: 500.0000 mg | ORAL_TABLET | Freq: Four times a day (QID) | ORAL | Status: DC | PRN

## 2020-11-24 MED ORDER — LIDOCAINE HCL (CARDIAC) PF 100 MG/5ML IV SOSY
PREFILLED_SYRINGE | INTRAVENOUS | Status: DC | PRN
  Administered 2020-11-24: 20 mg via INTRATRACHEAL

## 2020-11-24 MED FILL — METHOCARBAMOL 500 MG TABS: 500 | 13 days supply | Qty: 40 | Fill #0

## 2020-11-24 MED FILL — oxyCODONE HCL 5 MG TABS: 5 | 5 days supply | Qty: 60 | Fill #0

## 2020-11-24 SURGICAL SUPPLY — 61 items
BAG DECANTER FOR FLEXI CONT (MISCELLANEOUS) ×1 IMPLANT
BAG SPEC THK2 15X12 ZIP CLS (MISCELLANEOUS) ×1
BAG ZIPLOCK 12X15 (MISCELLANEOUS) ×2 IMPLANT
BLADE SAGITTAL 25.0X1.19X90 (BLADE) ×2 IMPLANT
BOWL SMART MIX CTS (DISPOSABLE) ×2 IMPLANT
CEMENT HV SMART SET (Cement) ×4 IMPLANT
CEMENT TIBIA MBT SIZE 4 (Knees) IMPLANT
COMP FEM CEM STD LT LCS (Orthopedic Implant) ×2 IMPLANT
COMP PATELLA PEGX3 CEM STAN (Knees) ×2 IMPLANT
COMPONENT FEM CEM STD LT LCS (Orthopedic Implant) IMPLANT
COMPONENT PTLLA PEGX3 CEM STAN (Knees) IMPLANT
COVER SURGICAL LIGHT HANDLE (MISCELLANEOUS) ×2 IMPLANT
COVER WAND RF STERILE (DRAPES) IMPLANT
CUFF TOURN SGL QUICK 34 (TOURNIQUET CUFF) ×2
CUFF TRNQT CYL 34X4.125X (TOURNIQUET CUFF) ×1 IMPLANT
DECANTER SPIKE VIAL GLASS SM (MISCELLANEOUS) ×2 IMPLANT
DRAPE IMP U-DRAPE 54X76 (DRAPES) ×2 IMPLANT
DRAPE ORTHO SPLIT 77X108 STRL (DRAPES)
DRAPE SHEET LG 3/4 BI-LAMINATE (DRAPES) ×4 IMPLANT
DRAPE SURG ORHT 6 SPLT 77X108 (DRAPES) IMPLANT
DRSG ADAPTIC 3X8 NADH LF (GAUZE/BANDAGES/DRESSINGS) ×2 IMPLANT
DRSG PAD ABDOMINAL 8X10 ST (GAUZE/BANDAGES/DRESSINGS) ×2 IMPLANT
DURAPREP 26ML APPLICATOR (WOUND CARE) ×4 IMPLANT
ELECT REM PT RETURN 15FT ADLT (MISCELLANEOUS) ×2 IMPLANT
GAUZE SPONGE 4X4 12PLY STRL (GAUZE/BANDAGES/DRESSINGS) ×2 IMPLANT
GLOVE BIOGEL PI IND STRL 8 (GLOVE) ×1 IMPLANT
GLOVE BIOGEL PI IND STRL 8.5 (GLOVE) ×1 IMPLANT
GLOVE BIOGEL PI INDICATOR 8 (GLOVE) ×1
GLOVE BIOGEL PI INDICATOR 8.5 (GLOVE) ×1
GLOVE ECLIPSE 8.0 STRL XLNG CF (GLOVE) ×4 IMPLANT
GLOVE ECLIPSE 8.5 STRL (GLOVE) ×4 IMPLANT
GOWN STRL REUS W/ TWL LRG LVL3 (GOWN DISPOSABLE) ×1 IMPLANT
GOWN STRL REUS W/TWL 2XL LVL3 (GOWN DISPOSABLE) ×2 IMPLANT
GOWN STRL REUS W/TWL LRG LVL3 (GOWN DISPOSABLE) ×2
HANDPIECE INTERPULSE COAX TIP (DISPOSABLE) ×2
HOLDER FOLEY CATH W/STRAP (MISCELLANEOUS) ×1 IMPLANT
INSERT LCL COMP RP STD 17.5MM (Knees) ×1 IMPLANT
KIT TURNOVER KIT A (KITS) ×1 IMPLANT
MANIFOLD NEPTUNE II (INSTRUMENTS) ×2 IMPLANT
NS IRRIG 1000ML POUR BTL (IV SOLUTION) ×2 IMPLANT
PACK TOTAL KNEE CUSTOM (KITS) ×2 IMPLANT
PADDING CAST COTTON 6X4 STRL (CAST SUPPLIES) ×3 IMPLANT
PENCIL SMOKE EVACUATOR (MISCELLANEOUS) IMPLANT
PIN STEINMAN FIXATION KNEE (PIN) ×1 IMPLANT
PROTECTOR NERVE ULNAR (MISCELLANEOUS) ×2 IMPLANT
SET HNDPC FAN SPRY TIP SCT (DISPOSABLE) ×1 IMPLANT
STAPLER VISISTAT 35W (STAPLE) ×2 IMPLANT
SUT BONE WAX W31G (SUTURE) ×2 IMPLANT
SUT ETHIBOND CT1 BRD #0 30IN (SUTURE) ×1 IMPLANT
SUT ETHIBOND NAB CT1 #1 30IN (SUTURE) ×5 IMPLANT
SUT MNCRL AB 3-0 PS2 18 (SUTURE) ×2 IMPLANT
SUT VIC AB 2-0 CT1 27 (SUTURE) ×2
SUT VIC AB 2-0 CT1 TAPERPNT 27 (SUTURE) IMPLANT
SUT VIC AB 2-0 PS2 27 (SUTURE) ×2 IMPLANT
TIBIA MBT CEMENT SIZE 4 (Knees) ×2 IMPLANT
TRAY FOLEY MTR SLVR 14FR STAT (SET/KITS/TRAYS/PACK) ×1 IMPLANT
TRAY FOLEY MTR SLVR 16FR STAT (SET/KITS/TRAYS/PACK) ×1 IMPLANT
UNDERPAD 30X36 HEAVY ABSORB (UNDERPADS AND DIAPERS) ×2 IMPLANT
WATER STERILE IRR 1000ML POUR (IV SOLUTION) ×4 IMPLANT
WRAP KNEE MAXI GEL POST OP (GAUZE/BANDAGES/DRESSINGS) ×2 IMPLANT
YANKAUER SUCT BULB TIP NO VENT (SUCTIONS) ×1 IMPLANT

## 2020-11-24 NOTE — Evaluation (Signed)
Physical Therapy Evaluation Patient Details Name: Shannon Andersen MRN: [id number] DOB: [birth year] Today's Date: 11/24/2020   History of Present Illness  64 yo female s/p L TKA 11/24/2020  Clinical Impression  On eval in PACU, pt was Min guard-Min assist for mobility. She walked ~50 feet x 2 with a RW. On 1st attempt negotiation stairs with use of 1 handrail only, L knee buckled. Transitioned to 2 hands on 1 rail and pt successfully negotiated up and over portable stairs. Practiced once more with 1 rail + cane-improved safety and performance with this technique. Recommended pt use 1 rail + cane to safely negotiate home entry stairs and stairs up to bedroom. Pain rated 5/10 during session. All education completed. No further questions/concerns from pt. Okay to d/c home from PT standpoint. Plan is for HHPT f/u after discharge.     Follow Up Recommendations Home health PT    Equipment Recommendations  None recommended by PT    Recommendations for Other Services       Precautions / Restrictions Precautions Precautions: Fall Restrictions Weight Bearing Restrictions: No Other Position/Activity Restrictions: WBAT      Mobility  Bed Mobility Overal bed mobility: Needs Assistance Bed Mobility: Supine to Sit     Supine to sit: Supervision;HOB elevated          Transfers Overall transfer level: Needs assistance Equipment used: Rolling walker (2 wheeled) Transfers: Sit to/from Stand Sit to Stand: Min guard         General transfer comment: Cues for safety, hand placement. Min guard for safety.  Ambulation/Gait Ambulation/Gait assistance: Min guard Gait Distance (Feet): 50 Feet (x2) Assistive device: Rolling walker (2 wheeled) Gait Pattern/deviations: Step-to pattern     General Gait Details: Cues for safety, technique, sequence. Min guard for safety.  Stairs Stairs: Yes     Number of Stairs: 5 General stair comments: Practiced x 1 with 2 hands on 1 rail. Practiced x  1 with 1 rail + cane. Improved safety and performance with 1 rail + cane. 1st attempt was with just 1 handrail but LE buckled. Recommended to pt that she negotiate stairs with 1 rail + cane-pt agreeable.  Wheelchair Mobility    Modified Rankin (Stroke Patients Only)       Balance Overall balance assessment: Needs assistance         Standing balance support: Bilateral upper extremity supported Standing balance-Leahy Scale: Fair                               Pertinent Vitals/Pain Pain Assessment: 0-10 Pain Score: 4  Pain Location: L knee Pain Descriptors / Indicators: Discomfort;Sore Pain Intervention(s): Limited activity within patient's tolerance;Monitored during session;Repositioned    Home Living Family/patient expects to be discharged to:: Private residence Living Arrangements: Spouse/significant other   Type of Home: House Home Access: Stairs to enter Entrance Stairs-Rails: Left Entrance Stairs-Number of Steps: 3 Home Layout: Bed/bath upstairs;Two level Home Equipment: Walker - 2 wheels;Bedside commode;Cane - single point Additional Comments: has CPM    Prior Function Level of Independence: Independent         Comments: works as Ship broker        Extremity/Trunk Assessment   Upper Extremity Assessment Upper Extremity Assessment: Overall WFL for tasks assessed    Lower Extremity Assessment Lower Extremity Assessment: Generalized weakness (L LE post op weakness)    Cervical / Trunk Assessment Cervical /  Trunk Assessment: Normal  Communication   Communication: No difficulties  Cognition Arousal/Alertness: Awake/alert Behavior During Therapy: WFL for tasks assessed/performed Overall Cognitive Status: Within Functional Limits for tasks assessed                                        General Comments      Exercises Total Joint Exercises Ankle Circles/Pumps: AROM;Both;10 reps Quad Sets: AROM;Both;10  reps Straight Leg Raises: AROM;Left;10 reps Goniometric ROM: ~10-65 degrees   Assessment/Plan    PT Assessment Patient needs continued PT services  PT Problem List Decreased strength;Decreased mobility;Decreased range of motion;Decreased activity tolerance;Decreased balance;Decreased knowledge of use of DME;Pain       PT Treatment Interventions DME instruction;Gait training;Therapeutic activities;Therapeutic exercise;Patient/family education;Balance training;Stair training;Functional mobility training    PT Goals (Current goals can be found in the Care Plan section)  Acute Rehab PT Goals Patient Stated Goal: home today. 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 PT Goal Formulation: With patient Time For Goal Achievement: 12/08/20 Potential to Achieve Goals: Good    Frequency 7X/week   Barriers to discharge        Co-evaluation               AM-PAC PT "6 Clicks" Mobility  Outcome Measure Help needed turning from your back to your side while in a flat bed without using bedrails?: A Little Help needed moving from lying on your back to sitting on the side of a flat bed without using bedrails?: A Little Help needed moving to and from a bed to a chair (including a wheelchair)?: A Little Help needed standing up from a chair using your arms (e.g., wheelchair or bedside chair)?: A Little Help needed to walk in hospital room?: A Little Help needed climbing 3-5 steps with a railing? : A Little 6 Click Score: 18    End of Session Equipment Utilized During Treatment: Gait belt Activity Tolerance: Patient tolerated treatment well Patient left: in chair;with call bell/phone within reach   PT Visit Diagnosis: Other abnormalities of gait and mobility (R26.89);Pain Pain - Right/Left: Left Pain - part of body: Knee    Time: 7824-2353 PT Time Calculation (min) (ACUTE ONLY): 27 min   Charges:   PT Evaluation $PT Eval Low Complexity: 1 Low PT Treatments $Gait Training: 8-22 mins            Faye Ramsay, PT Acute Rehabilitation  Office: [phone] Pager: [phone]

## 2020-11-24 NOTE — Discharge Instructions (Signed)

## 2020-11-24 NOTE — Op Note (Signed)
PATIENT ID:      TARRI GUILFOIL  MRN:     [id number] DOB/AGE:    03-09-1956 / 64 y.o.       OPERATIVE REPORT    DATE OF PROCEDURE:  11/24/2020       PREOPERATIVE DIAGNOSISEND STAGE:   LEFT KNEE OSTEOARTHRITIS                                                       Estimated body mass index is 23.96 kg/m as calculated from the following:   Height as of this encounter: 5\' 10"  (1.778 m).   Weight as of this encounter: 75.8 kg.     POSTOPERATIVE DIAGNOSIS: END STAGELEFT KNEE OSTEOARTHRITIS                                                                     Estimated body mass index is 23.96 kg/m as calculated from the following:   Height as of this encounter: 5\' 10"  (1.778 m).   Weight as of this encounter: 75.8 kg.     PROCEDURE:  Procedure(s): LEFT TOTAL KNEE ARTHROPLASTY     SURGEON:  , MD    ASSISTANT:   , PA-C   (Present and scrubbed throughout the case, critical for assistance with exposure, retraction, instrumentation, and closure.)          ANESTHESIA: regional, spinal and IV sedation     DRAINS: none :      TOURNIQUET TIME:  Total Tourniquet Time Documented: Thigh (Left) - 81 minutes Total: Thigh (Left) - 81 minutes     COMPLICATIONS:  None   CONDITION:  stable  PROCEDURE IN DETAIL: Norlene Campbell   [name] 11/24/2020, 12:40 PM

## 2020-11-24 NOTE — Progress Notes (Signed)
Assisted Dr. Greg Stoltzfus with left, ultrasound guided, adductor canal block. Side rails up, monitors on throughout procedure. See vital signs in flow sheet. Tolerated Procedure well.  

## 2020-11-24 NOTE — Anesthesia Postprocedure Evaluation (Signed)
Anesthesia Post Note  Patient: Shannon Andersen  Procedure(s) Performed: LEFT TOTAL KNEE ARTHROPLASTY (Left Knee)     Patient location during evaluation: PACU Anesthesia Type: Regional, MAC and Spinal Level of consciousness: oriented and awake and alert Pain management: pain level controlled Vital Signs Assessment: post-procedure vital signs reviewed and stable Respiratory status: spontaneous breathing, respiratory function stable and patient connected to nasal cannula oxygen Cardiovascular status: blood pressure returned to baseline and stable Postop Assessment: no headache, no backache and no apparent nausea or vomiting Anesthetic complications: no   No complications documented.  Last Vitals:  Vitals:   11/24/20 1445 11/24/20 1500  BP: 125/80   Pulse: 62 62  Resp: 16 14  Temp:    SpO2: 100% 100%    Last Pain:  Vitals:   11/24/20 1445  TempSrc:   PainSc: 4                  Osha Rane P Jedidiah Demartini

## 2020-11-24 NOTE — Anesthesia Procedure Notes (Signed)
Spinal  Patient location during procedure: OR Start time: 11/24/2020 10:41 AM End time: 11/24/2020 10:42 AM Staffing Performed: anesthesiologist  Anesthesiologist: Atilano Median, DO Preanesthetic Checklist Completed: patient identified, IV checked, site marked, risks and benefits discussed, surgical consent, monitors and equipment checked, pre-op evaluation and timeout performed Spinal Block Patient position: sitting Prep: DuraPrep Patient monitoring: heart rate, cardiac monitor, continuous pulse ox and blood pressure Approach: midline Location: L3-4 Injection technique: single-shot Needle Needle type: Pencan  Needle gauge: 24 G Needle length: 10 cm Additional Notes Patient identified. Risks/Benefits/Options discussed with patient including but not limited to bleeding, infection, nerve damage, paralysis, failed block, incomplete pain control, headache, blood pressure changes, nausea, vomiting, reactions to medications, itching and postpartum back pain. Confirmed with bedside nurse the patient's most recent platelet count. Confirmed with patient that they are not currently taking any anticoagulation, have any bleeding history or any family history of bleeding disorders. Patient expressed understanding and wished to proceed. All questions were answered. Sterile technique was used throughout the entire procedure. Please see nursing notes for vital signs. Warning signs of high block given to the patient including shortness of breath, tingling/numbness in hands, complete motor block, or any concerning symptoms with instructions to call for help. Patient was given instructions on fall risk and not to get out of bed. All questions and concerns addressed with instructions to call with any issues or inadequate analgesia.

## 2020-11-24 NOTE — Transfer of Care (Signed)
Immediate Anesthesia Transfer of Care Note  Patient: Shannon Andersen  Procedure(s) Performed: LEFT TOTAL KNEE ARTHROPLASTY (Left Knee)  Patient Location: PACU  Anesthesia Type:Spinal  Level of Consciousness: awake, alert , patient cooperative and responds to stimulation  Airway & Oxygen Therapy: Patient Spontanous Breathing and Patient connected to nasal cannula oxygen  Post-op Assessment: Report given to RN and Post -op Vital signs reviewed and stable  Post vital signs: Reviewed and stable  Last Vitals:  Vitals Value Taken Time  BP 108/65 11/24/20 1303  Temp    Pulse 78 11/24/20 1305  Resp 14 11/24/20 1305  SpO2 96 % 11/24/20 1305  Vitals shown include unvalidated device data.  Last Pain:  Vitals:   11/24/20 0845  TempSrc:   PainSc: 0-No pain      Patients Stated Pain Goal: 3 (11/24/20 0750)  Complications: No complications documented.

## 2020-11-24 NOTE — Anesthesia Procedure Notes (Signed)
Procedure Name: MAC Date/Time: 11/24/2020 10:35 AM Performed by: Michele Rockers, CRNA Pre-anesthesia Checklist: Patient identified, Emergency Drugs available, Suction available, Timeout performed and Patient being monitored Patient Re-evaluated:Patient Re-evaluated prior to induction Oxygen Delivery Method: Simple face mask

## 2020-11-24 NOTE — H&P (Signed)
The recent History & Physical has been reviewed. I have personally examined the patient today. There is no interval change to the documented History & Physical. The patient would like to proceed with the procedure.  Valeria Batman 11/24/2020,  9:51 AM

## 2020-11-24 NOTE — Discharge Summary (Signed)
Shannon CampbellPeter Whitfield, MD   Shannon CodeBrian Coretha Creswell, PA-C [address], [place], [place]  [id number]                             [phone]  PATIENT ID: Shannon BrownRuby W Andersen        MRN:  [id number]          DOB/AGE: [birth year] / 64 y.o.    DISCHARGE SUMMARY  ADMISSION DATE:    11/24/2020 DISCHARGE DATE:   11/24/2020   ADMISSION DIAGNOSIS: LEFT KNEE OSTEOARTHRITIS    DISCHARGE DIAGNOSIS:  LEFT KNEE OSTEOARTHRITIS    ADDITIONAL DIAGNOSIS: Active Problems:   * No active hospital problems. *  Past Medical History:  Diagnosis Date  . Arthritis   . Complication of anesthesia   . Osteopenia   . Osteoporosis    STIFFNESS IN HANDS  . PONV (postoperative nausea and vomiting)     PROCEDURE: Procedure(s): LEFT TOTAL KNEE ARTHROPLASTY  on 11/24/2020  CONSULTS: none    HISTORY: Shannon Brownuby W Andersen, 64 y.o. female, has a history of pain and functional disability in the left knee due to arthritis and has failed non-surgical conservative treatments for greater than 12 weeks to includeNSAID's and/or analgesics, corticosteriod injections, viscosupplementation injections, flexibility and strengthening excercises, weight reduction as appropriate and activity modification.  Onset of symptoms was gradual, starting 2 years ago with gradually worsening course since that time. The patient noted prior procedures on the knee to include  arthroscopy and menisectomy on the left knee(s).  Patient currently rates pain in the left knee(s) at 7 out of 10 with activity. Patient has night pain, worsening of pain with activity and weight bearing, pain that interferes with activities of daily living, pain with passive range of motion, crepitus and joint swelling.  Patient has evidence of subchondral sclerosis, periarticular osteophytes and joint space narrowing by imaging studies.     HOSPITAL COURSE:  Shannon Andersen is a 64 y.o. admitted on 11/24/2020 and found to have a diagnosis of LEFT KNEE OSTEOARTHRITIS.  After appropriate  laboratory studies were obtained  they were taken to the operating room on 11/24/2020 and underwent  Procedure(s): LEFT TOTAL KNEE ARTHROPLASTY  .   They were given perioperative antibiotics:  Anti-infectives (From admission, onward)   Start     Dose/Rate Route Frequency Ordered Stop   11/24/20 1330  ceFAZolin (ANCEF) IVPB 1 g/50 mL premix        1 g 100 mL/hr over 30 Minutes Intravenous Every 6 hours 11/24/20 1328 11/25/20 0129   11/24/20 0745  ceFAZolin (ANCEF) IVPB 2g/100 mL premix        2 g 200 mL/hr over 30 Minutes Intravenous On call to O.R. 11/24/20 0730 11/24/20 1035    .  Tolerated the procedure well.  Placed with a foley intraoperatively.    Discontinued at discharge   The remainder of the hospital course was dedicated to ambulation and strengthening.   The patient was discharged on Day of Surgery in  Stable condition.  Blood products given:none  DIAGNOSTIC STUDIES: Recent vital signs:  Patient Vitals for the past 24 hrs:  BP Temp Temp src Pulse Resp SpO2 Height Weight  11/24/20 1315 106/65 -- -- 69 12 98 % -- --  11/24/20 1304 108/65 -- -- 68 14 95 % -- --  11/24/20 0945 124/71 -- -- (!) 50 16 100 % -- --  11/24/20 0845 117/71 -- -- 62 10 100 % -- --  11/24/20 0840 119/81 -- -- 63 13 100 % -- --  11/24/20 0835 115/74 98.5 F (36.9 C) Oral 65 16 100 % -- --  11/24/20 0750 -- -- -- -- -- -- 5\' 10"  (1.778 m) 75.8 kg       Recent laboratory studies: Recent Labs    11/20/20 1050  WBC 8.4  HGB 14.2  HCT 43.6  PLT 182   Recent Labs    11/20/20 1050  NA 139  K 4.5  CL 106  CO2 28  BUN 27*  CREATININE 0.79  GLUCOSE 83  CALCIUM 9.6   Lab Results  Component Value Date   INR 1.0 11/20/2020   INR 1.02 09/27/2013     Recent Radiographic Studies :  DG Chest 2 View  Result Date: 11/20/2020 CLINICAL DATA:  Preop left total knee replacement. EXAM: CHEST - 2 VIEW COMPARISON:  06/07/2019 FINDINGS: The cardiomediastinal contours are normal. The lungs are clear.  Minimal chronic eventration of the right hemidiaphragm. Pulmonary vasculature is normal. No consolidation, pleural effusion, or pneumothorax. No acute osseous abnormalities are seen. Midthoracic spondylosis. IMPRESSION: No acute chest findings. Electronically Signed   By: 06/09/2019 M.D.   On: 11/20/2020 16:07    DISCHARGE INSTRUCTIONS: Discharge Instructions    CPM   Complete by: As directed    Continuous passive motion machine (CPM):      Use the CPM from 0 to 60 degrees for 6-8 hours per day.      You may increase by 5-10 per day.  You may break it up into 2 or 3 sessions per day.      Use CPM for 3-4 weeks or until you are told to stop.   Call MD / Call 911   Complete by: As directed    If you experience chest pain or shortness of breath, CALL 911 and be transported to the hospital emergency room.  If you develope a fever above 101 F, pus (white drainage) or increased drainage or redness at the wound, or calf pain, call your surgeon's office.   Change dressing   Complete by: As directed    DO NOT CHANGE YOUR DRESSING.   Constipation Prevention   Complete by: As directed    Drink plenty of fluids.  Prune juice may be helpful.  You may use a stool softener, such as Colace (over the counter) 100 mg twice a day.  Use MiraLax (over the counter) for constipation as needed.   Diet general   Complete by: As directed    Discharge instructions   Complete by: As directed    INSTRUCTIONS AFTER JOINT REPLACEMENT   Remove items at home which could result in a fall. This includes throw rugs or furniture in walking pathways ICE to the affected joint every three hours while awake for 30 minutes at a time, for at least the first 3-5 days, and then as needed for pain and swelling.  Continue to use ice for pain and swelling. You may notice swelling that will progress down to the foot and ankle.  This is normal after surgery.  Elevate your leg when you are not up walking on it.   Continue to use the  breathing machine you got in the hospital (incentive spirometer) which will help keep your temperature down.  It is common for your temperature to cycle up and down following surgery, especially at night when you are not up moving around and exerting yourself.  The breathing machine keeps your  lungs expanded and your temperature down.   DIET:  As you were doing prior to hospitalization, we recommend a well-balanced diet.  DRESSING / WOUND CARE / SHOWERING  Keep the surgical dressing until follow up.  The dressing is water proof, so you can shower without any extra covering.  IF THE DRESSING FALLS OFF or the wound gets wet inside, change the dressing with sterile gauze.  Please use good hand washing techniques before changing the dressing.  Do not use any lotions or creams on the incision until instructed by your surgeon.    ACTIVITY  Increase activity slowly as tolerated, but follow the weight bearing instructions below.   No driving for 6 weeks or until further direction given by your physician.  You cannot drive while taking narcotics.  No lifting or carrying greater than 10 lbs. until further directed by your surgeon. Avoid periods of inactivity such as sitting longer than an hour when not asleep. This helps prevent blood clots.  You may return to work once you are authorized by your doctor.     WEIGHT BEARING   Weight bearing as tolerated with assist device (walker, cane, etc) as directed, use it as long as suggested by your surgeon or therapist, typically at least 4-6 weeks.     EXERCISES  Results after joint replacement surgery are often greatly improved when you follow the exercise, range of motion and muscle strengthening exercises prescribed by your doctor. Safety measures are also important to protect the joint from further injury. Any time any of these exercises cause you to have increased pain or swelling, decrease what you are doing until you are comfortable again and then  slowly increase them. If you have problems or questions, call your caregiver or physical therapist for advice.   Rehabilitation is important following a joint replacement. After just a few days of immobilization, the muscles of the leg can become weakened and shrink (atrophy).  These exercises are designed to build up the tone and strength of the thigh and leg muscles and to improve motion. Often times heat used for twenty to thirty minutes before working out will loosen up your tissues and help with improving the range of motion but do not use heat for the first two weeks following surgery (sometimes heat can increase post-operative swelling).   These exercises can be done on a training (exercise) mat, on the floor, on a table or on a bed. Use whatever works the best and is most comfortable for you.    Use music or television while you are exercising so that the exercises are a pleasant break in your day. This will make your life better with the exercises acting as a break in your routine that you can look forward to.   Perform all exercises about fifteen times, three times per day or as directed.  You should exercise both the operative leg and the other leg as well.  Exercises include:   Quad Sets - Tighten up the muscle on the front of the thigh (Quad) and hold for 5-10 seconds.   Straight Leg Raises - With your knee straight (if you were given a brace, keep it on), lift the leg to 60 degrees, hold for 3 seconds, and slowly lower the leg.  Perform this exercise against resistance later as your leg gets stronger.  Leg Slides: Lying on your back, slowly slide your foot toward your buttocks, bending your knee up off the floor (only go as far as is  comfortable). Then slowly slide your foot back down until your leg is flat on the floor again.  Angel Wings: Lying on your back spread your legs to the side as far apart as you can without causing discomfort.  Hamstring Strength:  Lying on your back, push your  heel against the floor with your leg straight by tightening up the muscles of your buttocks.  Repeat, but this time bend your knee to a comfortable angle, and push your heel against the floor.  You may put a pillow under the heel to make it more comfortable if necessary.   A rehabilitation program following joint replacement surgery can speed recovery and prevent re-injury in the future due to weakened muscles. Contact your doctor or a physical therapist for more information on knee rehabilitation.    CONSTIPATION  Constipation is defined medically as fewer than three stools per week and severe constipation as less than one stool per week.  Even if you have a regular bowel pattern at home, your normal regimen is likely to be disrupted due to multiple reasons following surgery.  Combination of anesthesia, postoperative narcotics, change in appetite and fluid intake all can affect your bowels.   YOU MUST use at least one of the following options; they are listed in order of increasing strength to get the job done.  They are all available over the counter, and you may need to use some, POSSIBLY even all of these options:    Drink plenty of fluids (prune juice may be helpful) and high fiber foods Colace 100 mg by mouth twice a day  Senokot for constipation as directed and as needed Dulcolax (bisacodyl), take with full glass of water  Miralax (polyethylene glycol) once or twice a day as needed.  If you have tried all these things and are unable to have a bowel movement in the first 3-4 days after surgery call either your surgeon or your primary doctor.    If you experience loose stools or diarrhea, hold the medications until you stool forms back up.  If your symptoms do not get better within 1 week or if they get worse, check with your doctor.  If you experience "the worst abdominal pain ever" or develop nausea or vomiting, please contact the office immediately for further recommendations for  treatment.   ITCHING:  If you experience itching with your medications, try taking only a single pain pill, or even half a pain pill at a time.  You can also use Benadryl over the counter for itching or also to help with sleep.   TED HOSE STOCKINGS:  Use stockings on both legs until for at least 2 weeks or as directed by physician office. They may be removed at night for sleeping.  MEDICATIONS:  See your medication summary on the "After Visit Summary" that nursing will review with you.  You may have some home medications which will be placed on hold until you complete the course of blood thinner medication.  It is important for you to complete the blood thinner medication as prescribed.  PRECAUTIONS:  If you experience chest pain or shortness of breath - call 911 immediately for transfer to the hospital emergency department.   If you develop a fever greater that 101 F, purulent drainage from wound, increased redness or drainage from wound, foul odor from the wound/dressing, or calf pain - CONTACT YOUR SURGEON.  FOLLOW-UP APPOINTMENTS:  If you do not already have a post-op appointment, please call the office for an appointment to be seen by your surgeon.  Guidelines for how soon to be seen are listed in your "After Visit Summary", but are typically between 1-4 weeks after surgery.  OTHER INSTRUCTIONS:   Knee Replacement:  Do not place pillow under knee, focus on keeping the knee straight while resting. CPM instructions: 0-90 degrees, 2 hours in the morning, 2 hours in the afternoon, and 2 hours in the evening. Place foam block, curve side up under heel at all times except when in CPM or when walking.  DO NOT modify, tear, cut, or change the foam block in any way.  MAKE SURE YOU:  Understand these instructions.  Get help right away if you are not doing well or get worse.    Thank you for letting us be a part of your medical care team.  It is a  privilege we respect greatly.  We hope these instructions will help you stay on track for a fast and full recovery!   Do not put a pillow under the knee. Place it under the heel.   Complete by: As directed    Driving restrictions   Complete by: As directed    No driving for 6 weeks   Lifting restrictions   Complete by: As directed    No lifting for 6 weeks   Patient may shower   Complete by: As directed    You may shower over your dressing   TED hose   Complete by: As directed    Use stockings (TED hose) for 3 weeks on left  leg.  You may remove them at night for sleeping.   Weight bearing as tolerated   Complete by: As directed       DISCHARGE MEDICATIONS:   Allergies as of 11/24/2020      Reactions   Sulfa Antibiotics    Chills, fever, almost like a sunburn    Vascepa [icosapent Ethyl] Other (See Comments)   Joint Pain      Medication List    TAKE these medications   omega-3 acid ethyl esters 1 g capsule Commonly known as: LOVAZA Take 2 capsules by mouth 2 (two) times daily.   rosuvastatin 5 MG tablet Commonly known as: CRESTOR Take 5 mg by mouth daily.   venlafaxine XR 150 MG 24 hr capsule Commonly known as: EFFEXOR-XR Take 150 mg by mouth daily.   Vitamin D (Ergocalciferol) 1.25 MG (50000 UNIT) Caps capsule Commonly known as: DRISDOL Take 50,000 Units by mouth every Monday, Wednesday, and Friday.            Discharge Care Instructions  (From admission, onward)         Start     Ordered   11/24/20 0000  Change dressing       Comments: DO NOT CHANGE YOUR DRESSING.   11/24/20 1324   11/24/20 0000  Weight bearing as tolerated        11/24/20 1324          FOLLOW UP VISIT:    Follow-up Information    Valeria Batman, MD In 2 weeks.   Specialty: Orthopedic Surgery Contact information: [address] [place] [address] [phone]               DISPOSITION:   Home  CONDITION:  Stable   Oris Drone. Aleda Grana [facility] [phone]  11/24/2020 1:31 PM

## 2020-11-24 NOTE — Telephone Encounter (Signed)
Received disability paperwork   Forwarding to [employer] today 

## 2020-11-24 NOTE — Op Note (Signed)
NAME: Shannon Andersen, Shannon Andersen MEDICAL RECORD ZO:[id number] ACCOUNT [id number] DATE OF BIRTH:[birth year] FACILITY: [facility] LOCATION: [place], [place]  OPERATIVE REPORT  DATE OF PROCEDURE:  11/24/2020  PREOPERATIVE DIAGNOSIS:  End-stage osteoarthritis, left knee.  POSTOPERATIVE DIAGNOSIS:  End-stage osteoarthritis, left knee.  PROCEDURE:  Left total knee replacement.  SURGEON:  Joni Fears, MD  ASSISTANT:  Biagio Borg, PA-C, was present throughout the operative procedure to ensure its timely completion.  ANESTHESIA:  Spinal, adductor canal block and IV sedation.  COMPLICATIONS:  None.  COMPONENTS:  DePuy LCS standard femoral component, a #4 rotating keeled tibial tray with a 17.5 mm polyethylene bridging bearing, a metal backed 3 peg rotating patella.  All components were secured with polymethyl methacrylate.  DESCRIPTION OF PROCEDURE:  The patient was met in the holding area and identified the left knee as the appropriate operative site and marked it accordingly.  Anesthesia performed an adductor canal block.  The patient was then transferred to room #5.  Anesthesia performed a spinal without complications.  She was carefully placed on the operating table.  Nursing staff inserted a Foley catheter.  Urine was clear.  The left lower extremity was then placed in a thigh tourniquet.  The leg was then prepped with chlorhexidine scrub and DuraPrep x2 from the tourniquet to the tips of the toes.  Sterile draping was performed.  Timeout was called.  The left lower extremity was then elevated, was Esmarch exsanguinated with a proximal tourniquet at 325 mmHg.  The patient did have about 2 degrees of varus preoperatively that was correctable to neutral.  There was a fixed flexion contracture of about 7-10 degrees.  A midline longitudinal incision was then made centered about the patella extending from the superior pouch to the tibial tubercle.  Via sharp dissection,  incision was carried down to subcutaneous tissue.  The first layer of capsule was incised in the  midline.  A medial parapatellar incision was then made with the Bovie.  Joint was entered.  There was a clear yellow joint effusion of about 15 mL.  The patella was easily everted to 180 degrees laterally and the knee flexed to 90 degrees.  There were moderately large osteophytes along the medial and lateral femoral condyle and moderate amount of beefy red synovitis.  Osteophytes were removed and I sized the standard femoral component.  Synovectomy was performed.  First bony cut was then made with the knee flexed 90 degrees at the proximal tibia with a 7 degree angle of declination.  After each bony cut on the femur and the tibia, I checked the alignment with the external jig.  Cuts were then made on the femur using the standard femoral jigs.  I used 4-degree distal femoral valgus positioning.  Lamina spreaders were then inserted along the medial and lateral compartments.  I performed a medial and lateral meniscectomy.  I also excised the ACL and PCL.  Osteophytes were removed from the posterior femoral condyle with a 3/4 inch curved osteotome.   I saw a small popliteal cyst opening and I partially debrided the portion that was visible.  Flexion and extension gaps were symmetrical at 17.5 mm.  MCL and LCL remained intact throughout the procedure.  Final cuts were then made on the femur for the tapering purposes in the center hole.  Retractor was then carefully placed about the tibia.  It was advanced anteriorly.  I measured a #4 tibial tray.  This was pinned in place and alignment checked.  A center  hole was then made followed by the keeled cut.  With the tibial trial in place, the  17.5 mm polyethylene bearing was inserted followed by the trial standard femoral component.  The entire construct was reduced.  There was no opening with varus or valgus stress and full extension.  Patella was prepared by  removing 10 mm of bone, leaving 13 mm of patella thickness.  The three holes were made with the patellar jig.  The trial patella applied and then reduced.  Through a full range of motion, it remained stable.  The trial components were removed.  The joint was copiously irrigated with saline solution.  Final components were then impacted with polymethyl methacrylate.  We initially applied the #4 tibial tray followed by the 17.5 mm polyethylene bridging bearing  and then the standard femur.  The joint was reduced.  We then placed in flexion, removed any extraneous methacrylate.  With the knee placed in extension, the patella was prepared with methacrylate and a patellar clamp.  At approximately 16 minutes, the  methacrylate had matured during which time we irrigated the joint and then infiltrated the deep capsule with 0.25% Marcaine with epinephrine.  After the methacrylate had matured, we released the tourniquet.  We had nice capillary refill to the knee  joint.  Gross bleeders were Bovie coagulated.  The patient did receive 2 grams of Ancef IV preoperatively as well as a gram of TXA IV.  The deep capsule was then closed with a running 0 Ethibond, superficial capsule with running 2-0 Vicryl, the  subcutaneous with 3-0 Monocryl, skin closed with skin clips.  Sterile bulky dressing was applied followed by the patient's support stocking.  PLAN:  Outpatient oxycodone for pain, aspirin twice a day and muscle relaxant.  The patient did tolerate the procedure without complications.  HN/NUANCE  D:11/24/2020 T:11/24/2020 JOB:[id number]

## 2020-11-24 NOTE — Anesthesia Preprocedure Evaluation (Signed)
Anesthesia Evaluation  Patient identified by MRN, date of birth, ID band Patient awake    Reviewed: Allergy & Precautions, NPO status , Patient's Chart, lab work & pertinent test results  History of Anesthesia Complications (+) PONV  Airway Mallampati: II  TM Distance: >3 FB Neck ROM: Full    Dental  (+) Teeth Intact   Pulmonary neg pulmonary ROS,    Pulmonary exam normal        Cardiovascular negative cardio ROS   Rhythm:Regular Rate:Normal     Neuro/Psych negative neurological ROS  negative psych ROS   GI/Hepatic negative GI ROS, Neg liver ROS,   Endo/Other  negative endocrine ROS  Renal/GU negative Renal ROS  negative genitourinary   Musculoskeletal  (+) Arthritis , Osteoarthritis,    Abdominal (+)  Abdomen: soft. Bowel sounds: normal.  Peds  Hematology negative hematology ROS (+)   Anesthesia Other Findings   Reproductive/Obstetrics                             Anesthesia Physical Anesthesia Plan  ASA: II  Anesthesia Plan: MAC, Regional and Spinal   Post-op Pain Management:  Regional for Post-op pain   Induction:   PONV Risk Score and Plan: 3 and Ondansetron, Dexamethasone, Treatment may vary due to age or medical condition and Midazolam  Airway Management Planned: Mask and Oral ETT  Additional Equipment: None  Intra-op Plan:   Post-operative Plan:   Informed Consent: I have reviewed the patients History and Physical, chart, labs and discussed the procedure including the risks, benefits and alternatives for the proposed anesthesia with the patient or authorized representative who has indicated his/her understanding and acceptance.     Dental advisory given  Plan Discussed with: CRNA  Anesthesia Plan Comments: (Lab Results      Component                Value               Date                      WBC                      8.4                 11/20/2020                 HGB                      14.2                11/20/2020                HCT                      43.6                11/20/2020                MCV                      89.5                11/20/2020                PLT  182                 11/20/2020           Lab Results      Component                Value               Date                      NA                       139                 11/20/2020                K                        4.5                 11/20/2020                CO2                      28                  11/20/2020                GLUCOSE                  83                  11/20/2020                BUN                      27 (H)              11/20/2020                CREATININE               0.79                11/20/2020                CALCIUM                  9.6                 11/20/2020                GFRNONAA                 >60                 11/20/2020                GFRAA                    >60                 06/07/2019          )        Anesthesia Quick Evaluation

## 2020-11-24 NOTE — Anesthesia Procedure Notes (Signed)
Anesthesia Regional Block: Adductor canal block   Pre-Anesthetic Checklist: ,, timeout performed, Correct Patient, Correct Site, Correct Laterality, Correct Procedure, Correct Position, site marked, Risks and benefits discussed,  Surgical consent,  Pre-op evaluation,  At surgeon's request and post-op pain management  Laterality: Left  Prep: Dura Prep       Needles:  Injection technique: Single-shot  Needle Type: Echogenic Stimulator Needle     Needle Length: 9cm  Needle Gauge: 20     Additional Needles:   Procedures:,,,, ultrasound used (permanent image in chart),,,,  Narrative:  Start time: 11/24/2020 8:40 AM End time: 11/24/2020 8:45 AM Injection made incrementally with aspirations every 5 mL.  Performed by: Personally  Anesthesiologist: Atilano Median, DO  Additional Notes: Patient identified. Risks/Benefits/Options discussed with patient including but not limited to bleeding, infection, nerve damage, failed block, incomplete pain control. Patient expressed understanding and wished to proceed. All questions were answered. Sterile technique was used throughout the entire procedure. Please see nursing notes for vital signs. Aspirated in 5cc intervals with injection for negative confirmation. Patient was given instructions on fall risk and not to get out of bed. All questions and concerns addressed with instructions to call with any issues or inadequate analgesia.

## 2020-11-26 ENCOUNTER — Encounter (HOSPITAL_COMMUNITY): Payer: Self-pay | Admitting: Orthopaedic Surgery

## 2020-11-26 DIAGNOSIS — G8929 Other chronic pain: Secondary | ICD-10-CM | POA: Diagnosis not present

## 2020-11-26 DIAGNOSIS — Z471 Aftercare following joint replacement surgery: Secondary | ICD-10-CM | POA: Diagnosis not present

## 2020-11-26 DIAGNOSIS — Z7982 Long term (current) use of aspirin: Secondary | ICD-10-CM | POA: Diagnosis not present

## 2020-11-26 DIAGNOSIS — M810 Age-related osteoporosis without current pathological fracture: Secondary | ICD-10-CM | POA: Diagnosis not present

## 2020-11-26 DIAGNOSIS — Z96652 Presence of left artificial knee joint: Secondary | ICD-10-CM | POA: Diagnosis not present

## 2020-11-26 DIAGNOSIS — M8580 Other specified disorders of bone density and structure, unspecified site: Secondary | ICD-10-CM | POA: Diagnosis not present

## 2020-11-28 DIAGNOSIS — M8580 Other specified disorders of bone density and structure, unspecified site: Secondary | ICD-10-CM | POA: Diagnosis not present

## 2020-11-28 DIAGNOSIS — Z96652 Presence of left artificial knee joint: Secondary | ICD-10-CM | POA: Diagnosis not present

## 2020-11-28 DIAGNOSIS — M810 Age-related osteoporosis without current pathological fracture: Secondary | ICD-10-CM | POA: Diagnosis not present

## 2020-11-28 DIAGNOSIS — G8929 Other chronic pain: Secondary | ICD-10-CM | POA: Diagnosis not present

## 2020-11-28 DIAGNOSIS — Z7982 Long term (current) use of aspirin: Secondary | ICD-10-CM | POA: Diagnosis not present

## 2020-11-28 DIAGNOSIS — Z471 Aftercare following joint replacement surgery: Secondary | ICD-10-CM | POA: Diagnosis not present

## 2020-11-30 ENCOUNTER — Other Ambulatory Visit (HOSPITAL_COMMUNITY): Payer: Self-pay | Admitting: Obstetrics and Gynecology

## 2020-11-30 DIAGNOSIS — G8929 Other chronic pain: Secondary | ICD-10-CM | POA: Diagnosis not present

## 2020-11-30 DIAGNOSIS — Z7982 Long term (current) use of aspirin: Secondary | ICD-10-CM | POA: Diagnosis not present

## 2020-11-30 DIAGNOSIS — Z96652 Presence of left artificial knee joint: Secondary | ICD-10-CM | POA: Diagnosis not present

## 2020-11-30 DIAGNOSIS — M8580 Other specified disorders of bone density and structure, unspecified site: Secondary | ICD-10-CM | POA: Diagnosis not present

## 2020-11-30 DIAGNOSIS — Z471 Aftercare following joint replacement surgery: Secondary | ICD-10-CM | POA: Diagnosis not present

## 2020-11-30 DIAGNOSIS — M810 Age-related osteoporosis without current pathological fracture: Secondary | ICD-10-CM | POA: Diagnosis not present

## 2020-11-30 MED FILL — VENLAFAXINE HCL ER 150 MG C: 150 | 90 days supply | Qty: 90 | Fill #0

## 2020-12-01 DIAGNOSIS — M8580 Other specified disorders of bone density and structure, unspecified site: Secondary | ICD-10-CM | POA: Diagnosis not present

## 2020-12-01 DIAGNOSIS — Z7982 Long term (current) use of aspirin: Secondary | ICD-10-CM | POA: Diagnosis not present

## 2020-12-01 DIAGNOSIS — M810 Age-related osteoporosis without current pathological fracture: Secondary | ICD-10-CM | POA: Diagnosis not present

## 2020-12-01 DIAGNOSIS — G8929 Other chronic pain: Secondary | ICD-10-CM | POA: Diagnosis not present

## 2020-12-01 DIAGNOSIS — Z471 Aftercare following joint replacement surgery: Secondary | ICD-10-CM | POA: Diagnosis not present

## 2020-12-01 DIAGNOSIS — Z96652 Presence of left artificial knee joint: Secondary | ICD-10-CM | POA: Diagnosis not present

## 2020-12-02 DIAGNOSIS — Z471 Aftercare following joint replacement surgery: Secondary | ICD-10-CM | POA: Diagnosis not present

## 2020-12-02 DIAGNOSIS — Z96652 Presence of left artificial knee joint: Secondary | ICD-10-CM | POA: Diagnosis not present

## 2020-12-02 DIAGNOSIS — Z7982 Long term (current) use of aspirin: Secondary | ICD-10-CM | POA: Diagnosis not present

## 2020-12-02 DIAGNOSIS — G8929 Other chronic pain: Secondary | ICD-10-CM | POA: Diagnosis not present

## 2020-12-02 DIAGNOSIS — M810 Age-related osteoporosis without current pathological fracture: Secondary | ICD-10-CM | POA: Diagnosis not present

## 2020-12-02 DIAGNOSIS — M8580 Other specified disorders of bone density and structure, unspecified site: Secondary | ICD-10-CM | POA: Diagnosis not present

## 2020-12-03 ENCOUNTER — Encounter: Payer: Self-pay | Admitting: Orthopaedic Surgery

## 2020-12-03 ENCOUNTER — Ambulatory Visit (INDEPENDENT_AMBULATORY_CARE_PROVIDER_SITE_OTHER): Payer: 59 | Admitting: Orthopaedic Surgery

## 2020-12-03 ENCOUNTER — Telehealth: Payer: Self-pay | Admitting: Orthopaedic Surgery

## 2020-12-03 ENCOUNTER — Other Ambulatory Visit: Payer: Self-pay

## 2020-12-03 VITALS — Ht 70.0 in | Wt 167.0 lb

## 2020-12-03 DIAGNOSIS — Z96652 Presence of left artificial knee joint: Secondary | ICD-10-CM | POA: Insufficient documentation

## 2020-12-03 DIAGNOSIS — M1712 Unilateral primary osteoarthritis, left knee: Secondary | ICD-10-CM

## 2020-12-03 NOTE — Progress Notes (Signed)
Office Visit Note   Patient: Shannon Andersen           Date of Birth: 1956/07/27           MRN: [id number] Visit Date: 12/03/2020              Requested by: Chilton Greathouse, MD [address] [place],  [place] [address] PCP: Chilton Greathouse, MD   Assessment & Plan: Visit Diagnoses:  1. S/P total knee arthroplasty, left   2. Primary osteoarthritis of left knee   3. S/P total knee replacement using cement, left     Plan: 9 days status post primary left total knee replacement doing well.  No fever or chills.  No calf pain.  Lacks just a few degrees of full extension and flex about 85 degrees per the therapist note.  Wound is healing nicely without evidence of problems.  I removed every other staple and applied a Mepilex dressing.  We will continue working on range of motion exercises and strength.  Start outpatient therapy at [facility] next week or the week after depending upon schedules.  Return next week to remove the remainder of the staples and to obtain postop films  Follow-Up Instructions: Return in about 1 week (around 12/10/2020).   Orders:  Orders Placed This Encounter  Procedures  . Ambulatory referral to Physical Therapy   No orders of the defined types were placed in this encounter.     Procedures: No procedures performed   Clinical Data: No additional findings.   Subjective: Chief Complaint  Patient presents with  . Left Knee - Follow-up    Left TKA 11/24/2020  Patient presents today for follow up on her left knee. She had a left total knee arthroplasty on 11/24/2020. She is now one week out from surgery. Patient states that she is doing well. She has been home therapy. She is taking 1 tablet of oxycodone about every 4hours.   HPI  Review of Systems   Objective: Vital Signs: Ht 5\' 10"  (1.778 m)   Wt 167 lb (75.8 kg)   BMI 23.96 kg/m   Physical Exam  Ortho Exam left total knee incision healing without problem.  Every other staple removed.  Very  minimal swelling.  Does have some ecchymosis about her thigh probably from the tourniquet.  She is on aspirin and does take fish oil.  No calf pain.  No distal edema.  Neurologically intact.  No instability.  Specialty Comments:  No specialty comments available.  Imaging: No results found.   PMFS History: Patient Active Problem List   Diagnosis Date Noted  . S/P total knee replacement using cement, left 12/03/2020  . Low back pain 07/01/2020  . Other meniscus derangements, posterior horn of medial meniscus, left knee 06/11/2019  . Primary osteoarthritis of left knee 06/11/2019  . Chondromalacia patellae, left knee 06/11/2019  . Chronic pain of left knee 05/23/2019  . Dislocation, shoulder, anterior 10/01/2013  . 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 Arthritis   . Complication of anesthesia   . Osteopenia   . Osteoporosis    STIFFNESS IN HANDS  . PONV (postoperative nausea and vomiting)     History reviewed. No pertinent family history.  Past Surgical History:  Procedure Laterality Date  . BANKART REPAIR Right 10/01/2013   Procedure: Right Shoulder Arthroscopic Debridement with Labral Repair;  Surgeon: 10/03/2013, MD;  Location: [facility] OR;  Service: Orthopedics;  Laterality: Right;  .  BREAST SURGERY Left    BIOPSY-BENIGN  . COLONOSCOPY WITH PROPOFOL N/A 09/07/2015   Procedure: COLONOSCOPY WITH PROPOFOL;  Surgeon: Charolett Bumpers, MD;  Location: [facility];  Service: Endoscopy;  Laterality: N/A;  . KNEE ARTHROSCOPY WITH MEDIAL MENISECTOMY Left 06/11/2019   Procedure: LEFT KNEE ARTHROSCOPY WITH MEDIAL MENISECTOMY CHONDROPLASTY PATELLA  AND MEDIAL CONDYLE;  Surgeon: Valeria Batman, MD;  Location: [facility]S;  Service: Orthopedics;  Laterality: Left;  . TONSILLECTOMY    . TOTAL KNEE ARTHROPLASTY Left 11/24/2020   Procedure: LEFT TOTAL KNEE ARTHROPLASTY;  Surgeon: Valeria Batman, MD;  Location: [facility];  Service: Orthopedics;   Laterality: Left;   Social History   Occupational History  . Not on file  Tobacco Use  . Smoking status: Never Smoker  . Smokeless tobacco: Never Used  Vaping Use  . Vaping Use: Never used  Substance and Sexual Activity  . Alcohol use: Not on file    Comment: rarely  . Drug use: No  . Sexual activity: Not on file

## 2020-12-03 NOTE — Telephone Encounter (Signed)
Received $25.00 cash and Disability forms from patient     Forwarding to [employer]

## 2020-12-08 ENCOUNTER — Encounter: Payer: Self-pay | Admitting: Orthopedic Surgery

## 2020-12-08 ENCOUNTER — Ambulatory Visit (INDEPENDENT_AMBULATORY_CARE_PROVIDER_SITE_OTHER): Payer: 59

## 2020-12-08 ENCOUNTER — Ambulatory Visit (INDEPENDENT_AMBULATORY_CARE_PROVIDER_SITE_OTHER): Payer: 59 | Admitting: Orthopedic Surgery

## 2020-12-08 ENCOUNTER — Other Ambulatory Visit: Payer: Self-pay

## 2020-12-08 VITALS — Ht 70.0 in | Wt 167.0 lb

## 2020-12-08 DIAGNOSIS — M1711 Unilateral primary osteoarthritis, right knee: Secondary | ICD-10-CM | POA: Diagnosis not present

## 2020-12-08 DIAGNOSIS — Z96652 Presence of left artificial knee joint: Secondary | ICD-10-CM

## 2020-12-08 NOTE — Progress Notes (Signed)
Office Visit Note   Patient: Shannon Andersen           Date of Birth: 02-01-1956           MRN: [id number] Visit Date: 12/08/2020              Requested by: Chilton Greathouse, MD [address] [place],  [place] [address] PCP: Chilton Greathouse, MD   Assessment & Plan: Visit Diagnoses:  1. S/P total knee arthroplasty, left   2. S/P total knee replacement using cement, left     Plan:  #1: Staples removed and Steri-Strips were placed to the left knee. #2: Continue physical therapy as per protocol. #3: Oxycodone for pain as needed  Follow-Up Instructions: Return in about 3 weeks (around 12/29/2020) for follow up.   Orders:  Orders Placed This Encounter  Procedures  . XR KNEE 3 VIEW LEFT   No orders of the defined types were placed in this encounter.     Procedures: No procedures performed   Clinical Data: No additional findings.   Subjective: Chief Complaint  Patient presents with  . Left Knee - Follow-up    Left total knee arthroplasty 11/24/2020   HPI Patient presents today for her left knee. She is now two weeks out from a left total knee arthroplasty. Her surgery was on 11/24/2020. Patient states that she is feeling much better. She is taking oxycodone as needed.   Review of Systems   Objective: Vital Signs: Ht 5\' 10"  (1.778 m)   Wt 167 lb (75.8 kg)   BMI 23.96 kg/m   Physical Exam  Ortho Exam  Examination today reveals the wound to be healing per primam no signs of infection.  Staples removed and Steri-Strips were placed.  She has range of motion from near full extension to about 100 degrees.  Calf is supple nontender.  Good ligament stability.  Specialty Comments:  No specialty comments available.  Imaging: XR KNEE 3 VIEW LEFT  Result Date: 12/08/2020 Three-view x-ray of the left knee reveals good position of the prosthesis.    PMFS History: Current Outpatient Medications  Medication Sig Dispense Refill  . methocarbamol (ROBAXIN) 500 MG  tablet Take 1 tablet (500 mg total) by mouth every 8 (eight) hours as needed for muscle spasms. 40 tablet 0  . omega-3 acid ethyl esters (LOVAZA) 1 g capsule Take 2 capsules by mouth 2 (two) times daily.    12/10/2020 oxycodone (OXY-IR) 5 MG capsule Take 1-2 capsules (5-10 mg total) by mouth every 4 (four) hours as needed. 60 capsule 0  . rosuvastatin (CRESTOR) 5 MG tablet Take 5 mg by mouth daily.     Marland Kitchen venlafaxine XR (EFFEXOR-XR) 150 MG 24 hr capsule Take 150 mg by mouth daily.   4  . Vitamin D, Ergocalciferol, (DRISDOL) 50000 UNITS CAPS capsule Take 50,000 Units by mouth every Monday, Wednesday, and Friday.      No current facility-administered medications for this visit.    Patient Active Problem List   Diagnosis Date Noted  . S/P total knee replacement using cement, left 12/03/2020  . Low back pain 07/01/2020  . Other meniscus derangements, posterior horn of medial meniscus, left knee 06/11/2019  . Primary osteoarthritis of left knee 06/11/2019  . Chondromalacia patellae, left knee 06/11/2019  . Chronic pain of left knee 05/23/2019  . Dislocation, shoulder, anterior 10/01/2013  . 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 Arthritis   . Complication of anesthesia   .  Osteopenia   . Osteoporosis    STIFFNESS IN HANDS  . PONV (postoperative nausea and vomiting)     History reviewed. No pertinent family history.  Past Surgical History:  Procedure Laterality Date  . BANKART REPAIR Right 10/01/2013   Procedure: Right Shoulder Arthroscopic Debridement with Labral Repair;  Surgeon: Valeria Batman, MD;  Location: [facility] OR;  Service: Orthopedics;  Laterality: Right;  . BREAST SURGERY Left    BIOPSY-BENIGN  . COLONOSCOPY WITH PROPOFOL N/A 09/07/2015   Procedure: COLONOSCOPY WITH PROPOFOL;  Surgeon: Charolett Bumpers, MD;  Location: [facility];  Service: Endoscopy;  Laterality: N/A;  . KNEE ARTHROSCOPY WITH MEDIAL MENISECTOMY Left 06/11/2019   Procedure: LEFT  KNEE ARTHROSCOPY WITH MEDIAL MENISECTOMY CHONDROPLASTY PATELLA  AND MEDIAL CONDYLE;  Surgeon: Valeria Batman, MD;  Location: [facility];  Service: Orthopedics;  Laterality: Left;  . TONSILLECTOMY    . TOTAL KNEE ARTHROPLASTY Left 11/24/2020   Procedure: LEFT TOTAL KNEE ARTHROPLASTY;  Surgeon: Valeria Batman, MD;  Location: [facility];  Service: Orthopedics;  Laterality: Left;   Social History   Occupational History  . Not on file  Tobacco Use  . Smoking status: Never Smoker  . Smokeless tobacco: Never Used  Vaping Use  . Vaping Use: Never used  Substance and Sexual Activity  . Alcohol use: Not on file    Comment: rarely  . Drug use: No  . Sexual activity: Not on file

## 2020-12-10 DIAGNOSIS — M1711 Unilateral primary osteoarthritis, right knee: Secondary | ICD-10-CM | POA: Diagnosis not present

## 2020-12-14 DIAGNOSIS — M1711 Unilateral primary osteoarthritis, right knee: Secondary | ICD-10-CM | POA: Diagnosis not present

## 2020-12-16 DIAGNOSIS — M1711 Unilateral primary osteoarthritis, right knee: Secondary | ICD-10-CM | POA: Diagnosis not present

## 2020-12-17 MED FILL — OMEGA-3-ACID ETHYL ESTERS 1: 1 | 30 days supply | Qty: 120 | Fill #6

## 2020-12-18 DIAGNOSIS — M1711 Unilateral primary osteoarthritis, right knee: Secondary | ICD-10-CM | POA: Diagnosis not present

## 2020-12-21 DIAGNOSIS — M1711 Unilateral primary osteoarthritis, right knee: Secondary | ICD-10-CM | POA: Diagnosis not present

## 2020-12-23 DIAGNOSIS — M1711 Unilateral primary osteoarthritis, right knee: Secondary | ICD-10-CM | POA: Diagnosis not present

## 2020-12-24 DIAGNOSIS — M1711 Unilateral primary osteoarthritis, right knee: Secondary | ICD-10-CM | POA: Diagnosis not present

## 2020-12-29 ENCOUNTER — Ambulatory Visit (INDEPENDENT_AMBULATORY_CARE_PROVIDER_SITE_OTHER): Payer: 59 | Admitting: Orthopaedic Surgery

## 2020-12-29 ENCOUNTER — Encounter: Payer: Self-pay | Admitting: Orthopaedic Surgery

## 2020-12-29 ENCOUNTER — Other Ambulatory Visit: Payer: Self-pay

## 2020-12-29 VITALS — Ht 70.0 in | Wt 167.0 lb

## 2020-12-29 DIAGNOSIS — M1711 Unilateral primary osteoarthritis, right knee: Secondary | ICD-10-CM | POA: Diagnosis not present

## 2020-12-29 DIAGNOSIS — M1712 Unilateral primary osteoarthritis, left knee: Secondary | ICD-10-CM

## 2020-12-29 DIAGNOSIS — Z96652 Presence of left artificial knee joint: Secondary | ICD-10-CM

## 2020-12-29 NOTE — Progress Notes (Signed)
Office Visit Note   Patient: Shannon Andersen           Date of Birth: 1956-10-06           MRN: [id number] Visit Date: 12/29/2020              Requested by: Chilton Greathouse, MD [address] [place],  [place] [address] PCP: Chilton Greathouse, MD   Assessment & Plan: Visit Diagnoses:  1. Primary osteoarthritis of left knee   2. S/P total knee replacement using cement, left     Plan: 5-week status post primary left total knee replacement doing well. No use of ambulatory aid. Continues with physical therapy at [facility]. Has some mild pain along the medial aspect of her knee. No instability. No effusion. Wound looks fine. Continue with physical therapy plan to see her back in 2 weeks. I do not think she is ready to return to work full-time on 10 February. We will reassess that when she returns in 2 weeks  Follow-Up Instructions: Return in about 2 weeks (around 01/12/2021).   Orders:  No orders of the defined types were placed in this encounter.  No orders of the defined types were placed in this encounter.     Procedures: No procedures performed   Clinical Data: No additional findings.   Subjective: Chief Complaint  Patient presents with  . Left Knee - Routine Post Op  5 weeks status post left total knee replacement doing well. No fever or chills. No use of ambulatory aid. Sleeping well. Not taking any pain medicines. Continues with physical therapy at [facility] and very happy with her progress  HPI  Review of Systems   Objective: Vital Signs: Ht 5\' 10"  (1.778 m)   Wt 167 lb (75.8 kg)   BMI 23.96 kg/m   Physical Exam  Ortho Exam left knee incision healing without problem. Lacks just a few degrees to full extension actively. Passively I could extend her knee fully. Flexed 105 degrees with a goniometer. She relates getting 118 at physical therapy. No opening with a varus or valgus stress. Negative anterior drawer sign. No effusion. No increased heat. No popliteal pain  or calf discomfort  Specialty Comments:  No specialty comments available.  Imaging: No results found.   PMFS History: Patient Active Problem List   Diagnosis Date Noted  . S/P total knee replacement using cement, left 12/03/2020  . Low back pain 07/01/2020  . Other meniscus derangements, posterior horn of medial meniscus, left knee 06/11/2019  . Primary osteoarthritis of left knee 06/11/2019  . Chondromalacia patellae, left knee 06/11/2019  . Chronic pain of left knee 05/23/2019  . Dislocation, shoulder, anterior 10/01/2013  . 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 Arthritis   . Complication of anesthesia   . Osteopenia   . Osteoporosis    STIFFNESS IN HANDS  . PONV (postoperative nausea and vomiting)     No family history on file.  Past Surgical History:  Procedure Laterality Date  . BANKART REPAIR Right 10/01/2013   Procedure: Right Shoulder Arthroscopic Debridement with Labral Repair;  Surgeon: 10/03/2013, MD;  Location: [facility] OR;  Service: Orthopedics;  Laterality: Right;  . BREAST SURGERY Left    BIOPSY-BENIGN  . COLONOSCOPY WITH PROPOFOL N/A 09/07/2015   Procedure: COLONOSCOPY WITH PROPOFOL;  Surgeon: 09/09/2015, MD;  Location: [facility];  Service: Endoscopy;  Laterality: N/A;  . KNEE ARTHROSCOPY WITH MEDIAL MENISECTOMY Left  06/11/2019   Procedure: LEFT KNEE ARTHROSCOPY WITH MEDIAL MENISECTOMY CHONDROPLASTY PATELLA  AND MEDIAL CONDYLE;  Surgeon: Valeria Batman, MD;  Location: [facility];  Service: Orthopedics;  Laterality: Left;  . TONSILLECTOMY    . TOTAL KNEE ARTHROPLASTY Left 11/24/2020   Procedure: LEFT TOTAL KNEE ARTHROPLASTY;  Surgeon: Valeria Batman, MD;  Location: [facility];  Service: Orthopedics;  Laterality: Left;   Social History   Occupational History  . Not on file  Tobacco Use  . Smoking status: Never Smoker  . Smokeless tobacco: Never Used  Vaping Use  . Vaping Use: Never used  Substance and  Sexual Activity  . Alcohol use: Not on file    Comment: rarely  . Drug use: No  . Sexual activity: Not on file     Valeria Batman, MD   Note - This record has been created using [employer].  Chart creation errors have been sought, but may not always  have been located. Such creation errors do not reflect on  the standard of medical care.

## 2020-12-31 DIAGNOSIS — M1711 Unilateral primary osteoarthritis, right knee: Secondary | ICD-10-CM | POA: Diagnosis not present

## 2021-01-01 DIAGNOSIS — E785 Hyperlipidemia, unspecified: Secondary | ICD-10-CM | POA: Diagnosis not present

## 2021-01-01 DIAGNOSIS — Z0000 Encounter for general adult medical examination without abnormal findings: Secondary | ICD-10-CM | POA: Diagnosis not present

## 2021-01-01 DIAGNOSIS — M859 Disorder of bone density and structure, unspecified: Secondary | ICD-10-CM | POA: Diagnosis not present

## 2021-01-04 DIAGNOSIS — M1711 Unilateral primary osteoarthritis, right knee: Secondary | ICD-10-CM | POA: Diagnosis not present

## 2021-01-07 DIAGNOSIS — M1711 Unilateral primary osteoarthritis, right knee: Secondary | ICD-10-CM | POA: Diagnosis not present

## 2021-01-07 MED FILL — SHINGRIX 50 MCG SUS: 50 | 1 days supply | Qty: 1 | Fill #1

## 2021-01-08 DIAGNOSIS — R82998 Other abnormal findings in urine: Secondary | ICD-10-CM | POA: Diagnosis not present

## 2021-01-08 DIAGNOSIS — E559 Vitamin D deficiency, unspecified: Secondary | ICD-10-CM | POA: Diagnosis not present

## 2021-01-08 DIAGNOSIS — Z1339 Encounter for screening examination for other mental health and behavioral disorders: Secondary | ICD-10-CM | POA: Diagnosis not present

## 2021-01-08 DIAGNOSIS — E785 Hyperlipidemia, unspecified: Secondary | ICD-10-CM | POA: Diagnosis not present

## 2021-01-08 DIAGNOSIS — M8580 Other specified disorders of bone density and structure, unspecified site: Secondary | ICD-10-CM | POA: Diagnosis not present

## 2021-01-08 DIAGNOSIS — Z0000 Encounter for general adult medical examination without abnormal findings: Secondary | ICD-10-CM | POA: Diagnosis not present

## 2021-01-08 DIAGNOSIS — M25562 Pain in left knee: Secondary | ICD-10-CM | POA: Diagnosis not present

## 2021-01-08 DIAGNOSIS — G8929 Other chronic pain: Secondary | ICD-10-CM | POA: Diagnosis not present

## 2021-01-08 DIAGNOSIS — Z1331 Encounter for screening for depression: Secondary | ICD-10-CM | POA: Diagnosis not present

## 2021-01-12 ENCOUNTER — Other Ambulatory Visit: Payer: Self-pay

## 2021-01-12 ENCOUNTER — Ambulatory Visit (INDEPENDENT_AMBULATORY_CARE_PROVIDER_SITE_OTHER): Payer: 59 | Admitting: Orthopaedic Surgery

## 2021-01-12 ENCOUNTER — Encounter: Payer: Self-pay | Admitting: Orthopaedic Surgery

## 2021-01-12 VITALS — Ht 70.0 in | Wt 167.0 lb

## 2021-01-12 DIAGNOSIS — M1711 Unilateral primary osteoarthritis, right knee: Secondary | ICD-10-CM | POA: Diagnosis not present

## 2021-01-12 DIAGNOSIS — Z96652 Presence of left artificial knee joint: Secondary | ICD-10-CM

## 2021-01-12 DIAGNOSIS — K921 Melena: Secondary | ICD-10-CM | POA: Diagnosis not present

## 2021-01-12 NOTE — Progress Notes (Signed)
Office Visit Note   Patient: Shannon Andersen           Date of Birth: [birth year]           MRN: [id number] Visit Date: 01/12/2021              Requested by: Chilton Greathouse, MD [address] [place],  [place] [address] PCP: Chilton Greathouse, MD   Assessment & Plan: Visit Diagnoses:  1. S/P total knee replacement using cement, left     Plan: 7 weeks status post primary left total knee replacement doing well.  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  She will have another 3-4 more weeks of physical therapy for strengthening as well as a home exercise program.  Return in 1 month for reevaluation and consideration of back to work status  Follow-Up Instructions: Return in about 1 month (around 02/09/2021).   Orders:  No orders of the defined types were placed in this encounter.  No orders of the defined types were placed in this encounter.     Procedures: No procedures performed   Clinical Data: No additional findings.   Subjective: Chief Complaint  Patient presents with  . Left Knee - Follow-up    Left total knee arthroplasty 11/24/20  Patient presents today for follow up on her left knee. She is now 7 weeks out from a left total knee arthroplasty. Her surgery was on 11/24/2020. She has been going to [facility] twice weekly. She said that her therapist told her she is still weaker in that leg. She is taking tylenol daily. She said that everyday she notices improvement.  Not using any ambulatory aid and feeling much better than she was before surgery and even from her last office visit.  Very happy with the results  HPI  Review of Systems   Objective: Vital Signs: Ht 5\' 10"  (1.778 m)   Wt 167 lb (75.8 kg)   BMI 23.96 kg/m   Physical Exam  Ortho Exam Left knee was not hot warm or red.  Incision is healed nicely.  She lacks just a few degrees to full extension actively it might be just an extensor lag from quad  weakness and I can just about get her fully straight passively played I measured 105 degrees of flexion.  Shannon Andersen relates that the therapist had 125 degrees earlier.  Neurologically intact Specialty Comments:  No specialty comments available.  Imaging: No results found.   PMFS History: Patient Active Problem List   Diagnosis Date Noted  . S/P total knee replacement using cement, left 12/03/2020  . Low back pain 07/01/2020  . Other meniscus derangements, posterior horn of medial meniscus, left knee 06/11/2019  . Primary osteoarthritis of left knee 06/11/2019  . Chondromalacia patellae, left knee 06/11/2019  . Chronic pain of left knee 05/23/2019  . Dislocation, shoulder, anterior 10/01/2013  . 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 Arthritis   . Complication of anesthesia   . Osteopenia   . Osteoporosis    STIFFNESS IN HANDS  . PONV (postoperative nausea and vomiting)     History reviewed. No pertinent family history.  Past Surgical History:  Procedure Laterality Date  . BANKART REPAIR Right 10/01/2013   Procedure: Right Shoulder Arthroscopic Debridement with Labral Repair;  Surgeon: 10/03/2013, MD;  Location: [facility] OR;  Service: Orthopedics;  Laterality: Right;  . BREAST SURGERY  Left    BIOPSY-BENIGN  . COLONOSCOPY WITH PROPOFOL N/A 09/07/2015   Procedure: COLONOSCOPY WITH PROPOFOL;  Surgeon: Charolett Bumpers, MD;  Location: [facility];  Service: Endoscopy;  Laterality: N/A;  . KNEE ARTHROSCOPY WITH MEDIAL MENISECTOMY Left 06/11/2019   Procedure: LEFT KNEE ARTHROSCOPY WITH MEDIAL MENISECTOMY CHONDROPLASTY PATELLA  AND MEDIAL CONDYLE;  Surgeon: Valeria Batman, MD;  Location: [facility];  Service: Orthopedics;  Laterality: Left;  . TONSILLECTOMY    . TOTAL KNEE ARTHROPLASTY Left 11/24/2020   Procedure: LEFT TOTAL KNEE ARTHROPLASTY;  Surgeon: Valeria Batman, MD;  Location: [facility];  Service: Orthopedics;  Laterality: Left;   Social  History   Occupational History  . Not on file  Tobacco Use  . Smoking status: Never Smoker  . Smokeless tobacco: Never Used  Vaping Use  . Vaping Use: Never used  Substance and Sexual Activity  . Alcohol use: Not on file    Comment: rarely  . Drug use: No  . Sexual activity: Not on file

## 2021-01-15 DIAGNOSIS — M1711 Unilateral primary osteoarthritis, right knee: Secondary | ICD-10-CM | POA: Diagnosis not present

## 2021-01-19 DIAGNOSIS — M1711 Unilateral primary osteoarthritis, right knee: Secondary | ICD-10-CM | POA: Diagnosis not present

## 2021-01-22 DIAGNOSIS — M1711 Unilateral primary osteoarthritis, right knee: Secondary | ICD-10-CM | POA: Diagnosis not present

## 2021-01-25 DIAGNOSIS — M1711 Unilateral primary osteoarthritis, right knee: Secondary | ICD-10-CM | POA: Diagnosis not present

## 2021-01-29 DIAGNOSIS — M1711 Unilateral primary osteoarthritis, right knee: Secondary | ICD-10-CM | POA: Diagnosis not present

## 2021-01-31 MED FILL — MELOXICAM 15 MG TABLET: 15 | 90 days supply | Qty: 90 | Fill #2

## 2021-02-01 ENCOUNTER — Telehealth: Payer: Self-pay | Admitting: Orthopaedic Surgery

## 2021-02-01 MED FILL — VIT D2 1.25 MG (50,000 UNIT: 1.25 MG | 84 days supply | Qty: 36 | Fill #2

## 2021-02-01 NOTE — Telephone Encounter (Signed)
[employer] forms received. Sent to [employer] 

## 2021-02-04 ENCOUNTER — Other Ambulatory Visit: Payer: Self-pay

## 2021-02-04 ENCOUNTER — Encounter: Payer: Self-pay | Admitting: Orthopaedic Surgery

## 2021-02-04 ENCOUNTER — Ambulatory Visit (INDEPENDENT_AMBULATORY_CARE_PROVIDER_SITE_OTHER): Payer: 59 | Admitting: Orthopaedic Surgery

## 2021-02-04 VITALS — Ht 70.0 in | Wt 167.0 lb

## 2021-02-04 DIAGNOSIS — M1711 Unilateral primary osteoarthritis, right knee: Secondary | ICD-10-CM | POA: Diagnosis not present

## 2021-02-04 DIAGNOSIS — Z96652 Presence of left artificial knee joint: Secondary | ICD-10-CM

## 2021-02-04 NOTE — Progress Notes (Signed)
Office Visit Note   Patient: Shannon Andersen           Date of Birth: 06-Jul-1956           MRN: [id number] Visit Date: 02/04/2021              Requested by: Chilton Greathouse, MD [address] [place],  [place] [address] PCP: Chilton Greathouse, MD   Assessment & Plan: Visit Diagnoses:  1. S/P total knee replacement using cement, left     Plan: 10-week status post primary left total knee replacement and very happy with the results.  Continues to go to therapy for strengthening.  I like to see her back in 3 months and have her continue with the exercises.  She is going to retire sometime in early March.  I would not want her to return to work for at least 6 more weeks  Follow-Up Instructions: Return in about 3 months (around 05/04/2021).   Orders:  No orders of the defined types were placed in this encounter.  No orders of the defined types were placed in this encounter.     Procedures: No procedures performed   Clinical Data: No additional findings.   Subjective: Chief Complaint  Patient presents with  . Left Knee - Follow-up    Left total knee arthroplasty 11/24/20  Patient presents today for follow up on her left knee. She had a left total knee arthroplasty on 11/24/20. She is now 10 weeks out from surgery. She states that she is doing well. She said that her knee pops while going up or down stairs, but does not hurt. She has finished physical therapy. She does not take anything for pain.   HPI  Review of Systems   Objective: Vital Signs: Ht 5\' 10"  (1.778 m)   Wt 167 lb (75.8 kg)   BMI 23.96 kg/m   Physical Exam  Ortho Exam left knee was minimally hypertrophic compared to the right knee.  Full quick extension and over 120 degrees of flexion.  No opening with varus valgus stress.  Had about a 5 mm anterior drawer sign.  No localized areas of tenderness.  Wound is healing nicely.  No popliteal pain.  No calf pain.  Neurologically intact  Specialty Comments:  No  specialty comments available.  Imaging: No results found.   PMFS History: Patient Active Problem List   Diagnosis Date Noted  . S/P total knee replacement using cement, left 12/03/2020  . Low back pain 07/01/2020  . Other meniscus derangements, posterior horn of medial meniscus, left knee 06/11/2019  . Primary osteoarthritis of left knee 06/11/2019  . Chondromalacia patellae, left knee 06/11/2019  . Chronic pain of left knee 05/23/2019  . Dislocation, shoulder, anterior 10/01/2013  . 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 Arthritis   . Complication of anesthesia   . Osteopenia   . Osteoporosis    STIFFNESS IN HANDS  . PONV (postoperative nausea and vomiting)     History reviewed. No pertinent family history.  Past Surgical History:  Procedure Laterality Date  . BANKART REPAIR Right 10/01/2013   Procedure: Right Shoulder Arthroscopic Debridement with Labral Repair;  Surgeon: 10/03/2013, MD;  Location: [facility] OR;  Service: Orthopedics;  Laterality: Right;  . BREAST SURGERY Left    BIOPSY-BENIGN  . COLONOSCOPY WITH PROPOFOL N/A 09/07/2015   Procedure: COLONOSCOPY WITH PROPOFOL;  Surgeon: 09/09/2015, MD;  [facility]: [facility];  Service: Endoscopy;  Laterality: N/A;  . KNEE ARTHROSCOPY WITH MEDIAL MENISECTOMY Left 06/11/2019   Procedure: LEFT KNEE ARTHROSCOPY WITH MEDIAL MENISECTOMY CHONDROPLASTY PATELLA  AND MEDIAL CONDYLE;  Surgeon: Valeria Batman, MD;  Location: [facility];  Service: Orthopedics;  Laterality: Left;  . TONSILLECTOMY    . TOTAL KNEE ARTHROPLASTY Left 11/24/2020   Procedure: LEFT TOTAL KNEE ARTHROPLASTY;  Surgeon: Valeria Batman, MD;  Location: [facility];  Service: Orthopedics;  Laterality: Left;   Social History   Occupational History  . Not on file  Tobacco Use  . Smoking status: Never Smoker  . Smokeless tobacco: Never Used  Vaping Use  . Vaping Use: Never used  Substance and Sexual Activity  . Alcohol  use: Not on file    Comment: rarely  . Drug use: No  . Sexual activity: Not on file

## 2021-02-07 MED FILL — OMEGA-3-ACID ETHYL ESTERS 1: 1 | 30 days supply | Qty: 120 | Fill #7

## 2021-02-08 ENCOUNTER — Other Ambulatory Visit (HOSPITAL_COMMUNITY): Payer: Self-pay | Admitting: Internal Medicine

## 2021-02-08 MED FILL — ROSUVASTATIN CALCIUM 5 MG T: 5 | 90 days supply | Qty: 90 | Fill #0

## 2021-02-15 ENCOUNTER — Other Ambulatory Visit (HOSPITAL_COMMUNITY): Payer: Self-pay | Admitting: Gastroenterology

## 2021-02-15 DIAGNOSIS — Z791 Long term (current) use of non-steroidal anti-inflammatories (NSAID): Secondary | ICD-10-CM | POA: Diagnosis not present

## 2021-02-15 DIAGNOSIS — R195 Other fecal abnormalities: Secondary | ICD-10-CM | POA: Diagnosis not present

## 2021-02-15 MED FILL — PEG-3350 SOLUTION: 420 | 1 days supply | Qty: 4000 | Fill #0

## 2021-02-15 MED FILL — OMEPRAZOLE 40 MG CPDR: 40 | 30 days supply | Qty: 30 | Fill #0

## 2021-02-24 ENCOUNTER — Other Ambulatory Visit (HOSPITAL_COMMUNITY): Payer: Self-pay

## 2021-03-16 ENCOUNTER — Other Ambulatory Visit (HOSPITAL_COMMUNITY): Payer: Self-pay

## 2021-03-18 ENCOUNTER — Ambulatory Visit: Payer: 59 | Admitting: Orthopaedic Surgery

## 2021-03-23 ENCOUNTER — Other Ambulatory Visit (HOSPITAL_COMMUNITY): Payer: Self-pay

## 2021-04-03 IMAGING — CR DG CHEST 2V
2 series · 2 of 2 positions shown · non-contrast
Comparison: 06/07/2019

CLINICAL DATA: Preop left total knee replacement.

EXAM:
CHEST - 2 VIEW

[w chest pa]
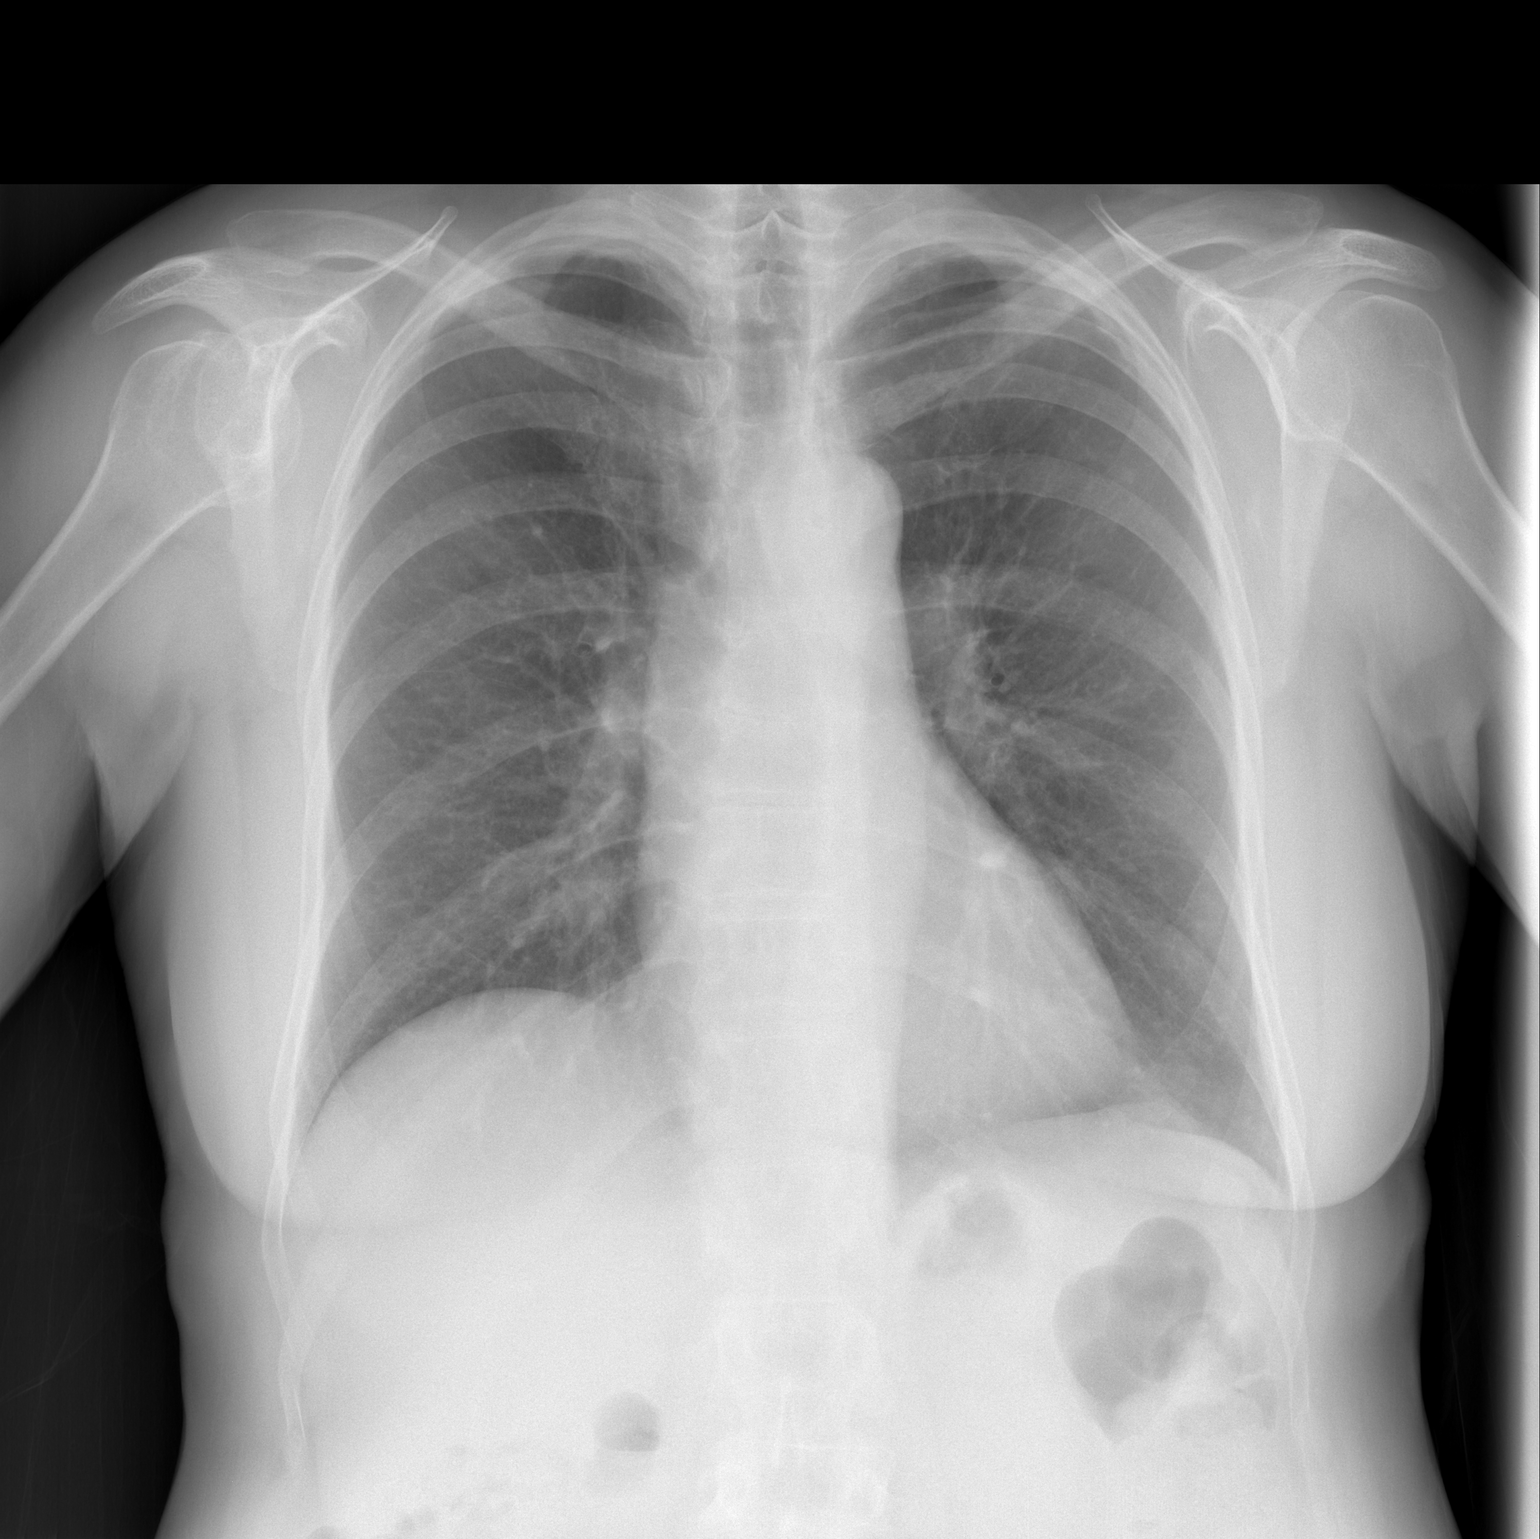

[w chest lat]
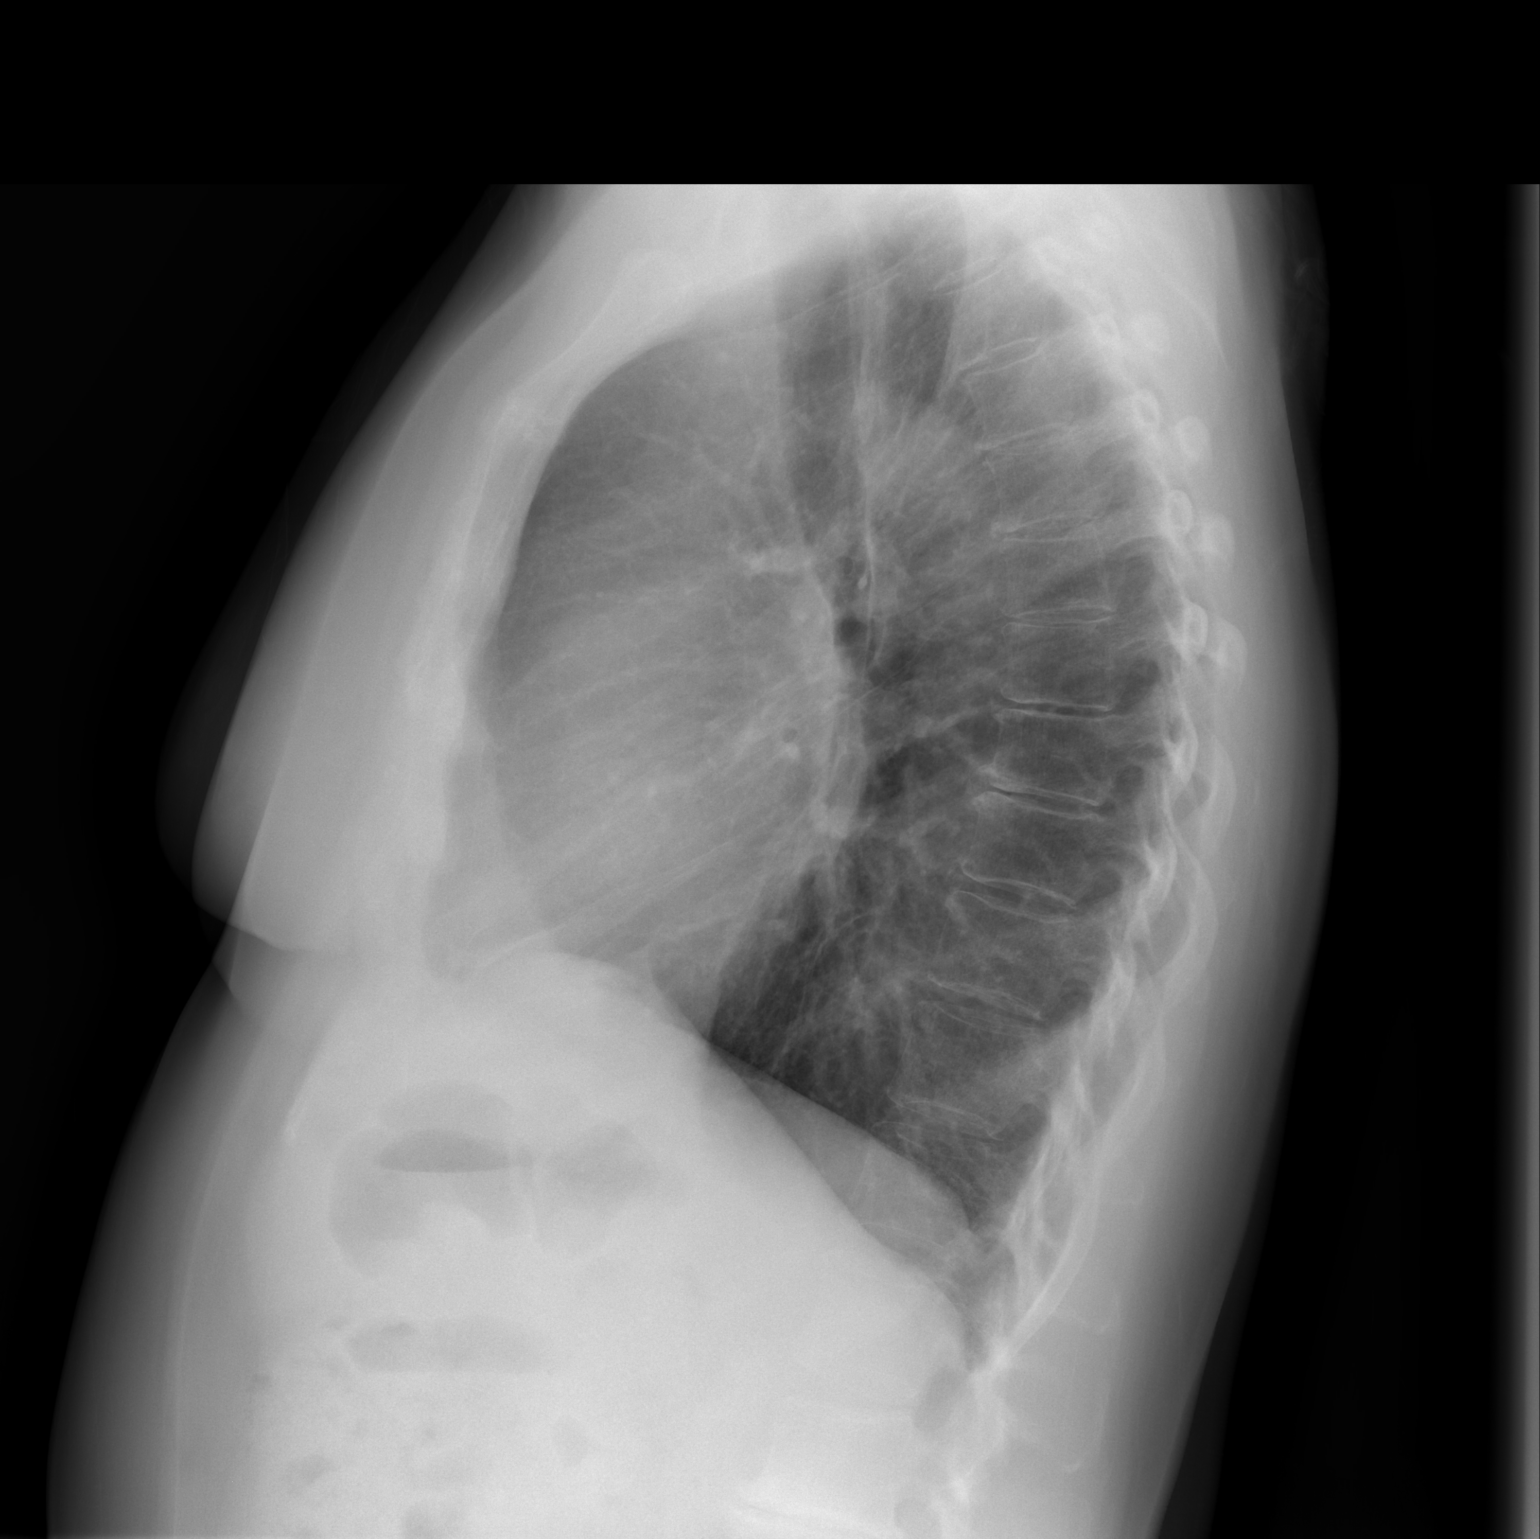

[2 of 2 positions shown; findings below may reference images not displayed]

FINDINGS: The cardiomediastinal contours are normal. The lungs are clear.
Minimal chronic eventration of the right hemidiaphragm. Pulmonary
vasculature is normal. No consolidation, pleural effusion, or
pneumothorax. No acute osseous abnormalities are seen. Midthoracic
spondylosis.
IMPRESSION: No acute chest findings.

## 2021-04-20 DIAGNOSIS — K5520 Angiodysplasia of colon without hemorrhage: Secondary | ICD-10-CM | POA: Diagnosis not present

## 2021-04-20 DIAGNOSIS — R195 Other fecal abnormalities: Secondary | ICD-10-CM | POA: Diagnosis not present

## 2021-04-20 DIAGNOSIS — K648 Other hemorrhoids: Secondary | ICD-10-CM | POA: Diagnosis not present

## 2021-05-03 DIAGNOSIS — H02831 Dermatochalasis of right upper eyelid: Secondary | ICD-10-CM | POA: Diagnosis not present

## 2021-05-03 DIAGNOSIS — H02823 Cysts of right eye, unspecified eyelid: Secondary | ICD-10-CM | POA: Diagnosis not present

## 2021-05-03 DIAGNOSIS — H02834 Dermatochalasis of left upper eyelid: Secondary | ICD-10-CM | POA: Diagnosis not present

## 2021-05-11 ENCOUNTER — Ambulatory Visit: Payer: 59 | Admitting: Orthopaedic Surgery

## 2021-05-19 ENCOUNTER — Ambulatory Visit (INDEPENDENT_AMBULATORY_CARE_PROVIDER_SITE_OTHER): Payer: Medicare Other | Admitting: Orthopaedic Surgery

## 2021-05-19 ENCOUNTER — Other Ambulatory Visit: Payer: Self-pay

## 2021-05-19 ENCOUNTER — Encounter: Payer: Self-pay | Admitting: Orthopaedic Surgery

## 2021-05-19 VITALS — Ht 70.0 in | Wt 170.0 lb

## 2021-05-19 DIAGNOSIS — Z96652 Presence of left artificial knee joint: Secondary | ICD-10-CM

## 2021-05-19 NOTE — Progress Notes (Signed)
Office Visit Note   Patient: Shannon Andersen           Date of Birth: [birth year]           MRN: [id number] Visit Date: 05/19/2021              Requested by: Chilton Greathouse, MD [address] [place],  [place] [address] PCP: Chilton Greathouse, MD   Assessment & Plan: Visit Diagnoses:  1. S/P total knee replacement using cement, left     Plan: 20-month status post primary left total knee replacement doing very well without any restrictions.  Very happy with the results.  Discussed continuing quad strengthening exercises.  Will need antibiotics for any invasive procedure and return to see me as needed  Follow-Up Instructions: Return if symptoms worsen or fail to improve.   Orders:  No orders of the defined types were placed in this encounter.  No orders of the defined types were placed in this encounter.     Procedures: No procedures performed   Clinical Data: No additional findings.   Subjective: Chief Complaint  Patient presents with  . Left Knee - Follow-up    11/24/2020 Left TKA  Patient returns for six week follow up. She is status post left total knee arthroplasty on 11/24/2020.  She states that things are going well and she is able to do her daily activities. She has intermittent stiffness, but no acute pain. She denies having to take any pain medication.   HPI  Review of Systems   Objective: Vital Signs: Ht 5\' 10"  (1.778 m)   Wt 170 lb (77.1 kg)   BMI 24.39 kg/m   Physical Exam Constitutional:      Appearance: She is well-developed.  Eyes:     Pupils: Pupils are equal, round, and reactive to light.  Pulmonary:     Effort: Pulmonary effort is normal.  Skin:    General: Skin is warm and dry.  Neurological:     Mental Status: She is alert and oriented to person, place, and time.  Psychiatric:        Behavior: Behavior normal.     Ortho Exam left total knee replacement with full extension of flexed 105 with a goniometer.  No instability.  Knee was  not hot red warm or swollen.  No effusion.  No localized areas of tenderness.  Incision is healed very nicely.  Neurologically intact.  Walks without any ambulatory aid  Specialty Comments:  No specialty comments available.  Imaging: No results found.   PMFS History: Patient Active Problem List   Diagnosis Date Noted  . S/P total knee replacement using cement, left 12/03/2020  . Low back pain 07/01/2020  . Other meniscus derangements, posterior horn of medial meniscus, left knee 06/11/2019  . Primary osteoarthritis of left knee 06/11/2019  . Chondromalacia patellae, left knee 06/11/2019  . Chronic pain of left knee 05/23/2019  . Dislocation, shoulder, anterior 10/01/2013  . 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 Arthritis   . Complication of anesthesia   . Osteopenia   . Osteoporosis    STIFFNESS IN HANDS  . PONV (postoperative nausea and vomiting)     History reviewed. No pertinent family history.  Past Surgical History:  Procedure Laterality Date  . BANKART REPAIR Right 10/01/2013   Procedure: Right Shoulder Arthroscopic Debridement with Labral Repair;  Surgeon: 10/03/2013, MD;  Location: [facility];  Service: Orthopedics;  Laterality: Right;  .  BREAST SURGERY Left    BIOPSY-BENIGN  . COLONOSCOPY WITH PROPOFOL N/A 09/07/2015   Procedure: COLONOSCOPY WITH PROPOFOL;  Surgeon: Charolett Bumpers, MD;  Location: [facility];  Service: Endoscopy;  Laterality: N/A;  . KNEE ARTHROSCOPY WITH MEDIAL MENISECTOMY Left 06/11/2019   Procedure: LEFT KNEE ARTHROSCOPY WITH MEDIAL MENISECTOMY CHONDROPLASTY PATELLA  AND MEDIAL CONDYLE;  Surgeon: Valeria Batman, MD;  Location: [facility] ORS;  Service: Orthopedics;  Laterality: Left;  . TONSILLECTOMY    . TOTAL KNEE ARTHROPLASTY Left 11/24/2020   Procedure: LEFT TOTAL KNEE ARTHROPLASTY;  Surgeon: Valeria Batman, MD;  Location: [facility];  Service: Orthopedics;  Laterality: Left;   Social History    Occupational History  . Not on file  Tobacco Use  . Smoking status: Never Smoker  . Smokeless tobacco: Never Used  Vaping Use  . Vaping Use: Never used  Substance and Sexual Activity  . Alcohol use: Not on file    Comment: rarely  . Drug use: No  . Sexual activity: Not on file

## 2021-06-23 DIAGNOSIS — D485 Neoplasm of uncertain behavior of skin: Secondary | ICD-10-CM | POA: Diagnosis not present

## 2021-06-23 DIAGNOSIS — H04221 Epiphora due to insufficient drainage, right lacrimal gland: Secondary | ICD-10-CM | POA: Diagnosis not present

## 2021-06-23 DIAGNOSIS — H53031 Strabismic amblyopia, right eye: Secondary | ICD-10-CM | POA: Diagnosis not present

## 2021-06-23 DIAGNOSIS — H57813 Brow ptosis, bilateral: Secondary | ICD-10-CM | POA: Diagnosis not present

## 2021-06-30 DIAGNOSIS — Z20822 Contact with and (suspected) exposure to covid-19: Secondary | ICD-10-CM | POA: Diagnosis not present

## 2021-08-25 DIAGNOSIS — L929 Granulomatous disorder of the skin and subcutaneous tissue, unspecified: Secondary | ICD-10-CM | POA: Diagnosis not present

## 2021-08-25 DIAGNOSIS — H018 Other specified inflammations of eyelid: Secondary | ICD-10-CM | POA: Diagnosis not present

## 2021-08-25 DIAGNOSIS — H04221 Epiphora due to insufficient drainage, right lacrimal gland: Secondary | ICD-10-CM | POA: Diagnosis not present

## 2021-08-25 DIAGNOSIS — H53031 Strabismic amblyopia, right eye: Secondary | ICD-10-CM | POA: Diagnosis not present

## 2021-08-25 DIAGNOSIS — H57813 Brow ptosis, bilateral: Secondary | ICD-10-CM | POA: Diagnosis not present

## 2021-08-25 DIAGNOSIS — D485 Neoplasm of uncertain behavior of skin: Secondary | ICD-10-CM | POA: Diagnosis not present

## 2021-09-08 DIAGNOSIS — H0231 Blepharochalasis right upper eyelid: Secondary | ICD-10-CM | POA: Diagnosis not present

## 2021-09-08 DIAGNOSIS — H018 Other specified inflammations of eyelid: Secondary | ICD-10-CM | POA: Diagnosis not present

## 2021-09-08 DIAGNOSIS — H57813 Brow ptosis, bilateral: Secondary | ICD-10-CM | POA: Diagnosis not present

## 2021-09-08 DIAGNOSIS — H0234 Blepharochalasis left upper eyelid: Secondary | ICD-10-CM | POA: Diagnosis not present

## 2021-09-20 DIAGNOSIS — Z1231 Encounter for screening mammogram for malignant neoplasm of breast: Secondary | ICD-10-CM | POA: Diagnosis not present

## 2021-11-09 ENCOUNTER — Other Ambulatory Visit: Payer: Self-pay

## 2021-11-09 ENCOUNTER — Telehealth: Payer: Self-pay | Admitting: Orthopaedic Surgery

## 2021-11-09 ENCOUNTER — Other Ambulatory Visit (HOSPITAL_COMMUNITY): Payer: Self-pay

## 2021-11-09 MED ORDER — AMOXICILLIN 500 MG PO CAPS
ORAL_CAPSULE | ORAL | 0 refills | Status: DC
  Filled 2021-11-09: qty 12, 3d supply, fill #0

## 2021-11-09 NOTE — Telephone Encounter (Signed)
Pt called stating she has a dentist appt in 11 days and had surgery in 11/2020 and would like to have her antibiotics called in. Pt would like a CB when this is called in.   [phone]

## 2021-11-09 NOTE — Telephone Encounter (Signed)
Called and let patient know that it has been sent in to her [facility].

## 2021-11-22 DIAGNOSIS — M8588 Other specified disorders of bone density and structure, other site: Secondary | ICD-10-CM | POA: Diagnosis not present

## 2021-11-22 DIAGNOSIS — Z6827 Body mass index (BMI) 27.0-27.9, adult: Secondary | ICD-10-CM | POA: Diagnosis not present

## 2021-11-22 DIAGNOSIS — N958 Other specified menopausal and perimenopausal disorders: Secondary | ICD-10-CM | POA: Diagnosis not present

## 2021-11-22 DIAGNOSIS — Z01419 Encounter for gynecological examination (general) (routine) without abnormal findings: Secondary | ICD-10-CM | POA: Diagnosis not present

## 2021-11-23 DIAGNOSIS — H524 Presbyopia: Secondary | ICD-10-CM | POA: Diagnosis not present

## 2021-11-23 DIAGNOSIS — H52209 Unspecified astigmatism, unspecified eye: Secondary | ICD-10-CM | POA: Diagnosis not present

## 2021-11-23 DIAGNOSIS — H5203 Hypermetropia, bilateral: Secondary | ICD-10-CM | POA: Diagnosis not present

## 2021-12-14 DIAGNOSIS — M50323 Other cervical disc degeneration at C6-C7 level: Secondary | ICD-10-CM | POA: Diagnosis not present

## 2021-12-14 DIAGNOSIS — M50322 Other cervical disc degeneration at C5-C6 level: Secondary | ICD-10-CM | POA: Diagnosis not present

## 2021-12-14 DIAGNOSIS — M9901 Segmental and somatic dysfunction of cervical region: Secondary | ICD-10-CM | POA: Diagnosis not present

## 2021-12-16 DIAGNOSIS — M50323 Other cervical disc degeneration at C6-C7 level: Secondary | ICD-10-CM | POA: Diagnosis not present

## 2021-12-16 DIAGNOSIS — M50322 Other cervical disc degeneration at C5-C6 level: Secondary | ICD-10-CM | POA: Diagnosis not present

## 2021-12-16 DIAGNOSIS — M9901 Segmental and somatic dysfunction of cervical region: Secondary | ICD-10-CM | POA: Diagnosis not present

## 2021-12-21 DIAGNOSIS — M50323 Other cervical disc degeneration at C6-C7 level: Secondary | ICD-10-CM | POA: Diagnosis not present

## 2021-12-21 DIAGNOSIS — M9901 Segmental and somatic dysfunction of cervical region: Secondary | ICD-10-CM | POA: Diagnosis not present

## 2021-12-21 DIAGNOSIS — M50322 Other cervical disc degeneration at C5-C6 level: Secondary | ICD-10-CM | POA: Diagnosis not present

## 2021-12-23 DIAGNOSIS — M50323 Other cervical disc degeneration at C6-C7 level: Secondary | ICD-10-CM | POA: Diagnosis not present

## 2021-12-23 DIAGNOSIS — M9901 Segmental and somatic dysfunction of cervical region: Secondary | ICD-10-CM | POA: Diagnosis not present

## 2021-12-23 DIAGNOSIS — M50322 Other cervical disc degeneration at C5-C6 level: Secondary | ICD-10-CM | POA: Diagnosis not present

## 2021-12-28 DIAGNOSIS — M50322 Other cervical disc degeneration at C5-C6 level: Secondary | ICD-10-CM | POA: Diagnosis not present

## 2021-12-28 DIAGNOSIS — M9901 Segmental and somatic dysfunction of cervical region: Secondary | ICD-10-CM | POA: Diagnosis not present

## 2021-12-28 DIAGNOSIS — M50323 Other cervical disc degeneration at C6-C7 level: Secondary | ICD-10-CM | POA: Diagnosis not present

## 2021-12-30 DIAGNOSIS — M50323 Other cervical disc degeneration at C6-C7 level: Secondary | ICD-10-CM | POA: Diagnosis not present

## 2021-12-30 DIAGNOSIS — M50322 Other cervical disc degeneration at C5-C6 level: Secondary | ICD-10-CM | POA: Diagnosis not present

## 2021-12-30 DIAGNOSIS — M9901 Segmental and somatic dysfunction of cervical region: Secondary | ICD-10-CM | POA: Diagnosis not present

## 2021-12-30 DIAGNOSIS — L820 Inflamed seborrheic keratosis: Secondary | ICD-10-CM | POA: Diagnosis not present

## 2022-01-04 DIAGNOSIS — M9901 Segmental and somatic dysfunction of cervical region: Secondary | ICD-10-CM | POA: Diagnosis not present

## 2022-01-04 DIAGNOSIS — M50322 Other cervical disc degeneration at C5-C6 level: Secondary | ICD-10-CM | POA: Diagnosis not present

## 2022-01-04 DIAGNOSIS — M50323 Other cervical disc degeneration at C6-C7 level: Secondary | ICD-10-CM | POA: Diagnosis not present

## 2022-01-06 DIAGNOSIS — M50323 Other cervical disc degeneration at C6-C7 level: Secondary | ICD-10-CM | POA: Diagnosis not present

## 2022-01-06 DIAGNOSIS — M50322 Other cervical disc degeneration at C5-C6 level: Secondary | ICD-10-CM | POA: Diagnosis not present

## 2022-01-06 DIAGNOSIS — M9901 Segmental and somatic dysfunction of cervical region: Secondary | ICD-10-CM | POA: Diagnosis not present

## 2022-01-12 DIAGNOSIS — M9901 Segmental and somatic dysfunction of cervical region: Secondary | ICD-10-CM | POA: Diagnosis not present

## 2022-01-12 DIAGNOSIS — M50322 Other cervical disc degeneration at C5-C6 level: Secondary | ICD-10-CM | POA: Diagnosis not present

## 2022-01-12 DIAGNOSIS — M50323 Other cervical disc degeneration at C6-C7 level: Secondary | ICD-10-CM | POA: Diagnosis not present

## 2022-01-18 DIAGNOSIS — R7989 Other specified abnormal findings of blood chemistry: Secondary | ICD-10-CM | POA: Diagnosis not present

## 2022-01-18 DIAGNOSIS — M859 Disorder of bone density and structure, unspecified: Secondary | ICD-10-CM | POA: Diagnosis not present

## 2022-01-18 DIAGNOSIS — E785 Hyperlipidemia, unspecified: Secondary | ICD-10-CM | POA: Diagnosis not present

## 2022-01-19 DIAGNOSIS — M50322 Other cervical disc degeneration at C5-C6 level: Secondary | ICD-10-CM | POA: Diagnosis not present

## 2022-01-19 DIAGNOSIS — M50323 Other cervical disc degeneration at C6-C7 level: Secondary | ICD-10-CM | POA: Diagnosis not present

## 2022-01-19 DIAGNOSIS — M9901 Segmental and somatic dysfunction of cervical region: Secondary | ICD-10-CM | POA: Diagnosis not present

## 2022-01-25 DIAGNOSIS — R202 Paresthesia of skin: Secondary | ICD-10-CM | POA: Diagnosis not present

## 2022-01-25 DIAGNOSIS — R82998 Other abnormal findings in urine: Secondary | ICD-10-CM | POA: Diagnosis not present

## 2022-01-25 DIAGNOSIS — J302 Other seasonal allergic rhinitis: Secondary | ICD-10-CM | POA: Diagnosis not present

## 2022-01-25 DIAGNOSIS — Z0000 Encounter for general adult medical examination without abnormal findings: Secondary | ICD-10-CM | POA: Diagnosis not present

## 2022-01-25 DIAGNOSIS — E785 Hyperlipidemia, unspecified: Secondary | ICD-10-CM | POA: Diagnosis not present

## 2022-01-25 DIAGNOSIS — M8580 Other specified disorders of bone density and structure, unspecified site: Secondary | ICD-10-CM | POA: Diagnosis not present

## 2022-01-26 DIAGNOSIS — M9901 Segmental and somatic dysfunction of cervical region: Secondary | ICD-10-CM | POA: Diagnosis not present

## 2022-01-26 DIAGNOSIS — M50322 Other cervical disc degeneration at C5-C6 level: Secondary | ICD-10-CM | POA: Diagnosis not present

## 2022-01-26 DIAGNOSIS — M50323 Other cervical disc degeneration at C6-C7 level: Secondary | ICD-10-CM | POA: Diagnosis not present

## 2022-02-01 DIAGNOSIS — M50323 Other cervical disc degeneration at C6-C7 level: Secondary | ICD-10-CM | POA: Diagnosis not present

## 2022-02-01 DIAGNOSIS — M9901 Segmental and somatic dysfunction of cervical region: Secondary | ICD-10-CM | POA: Diagnosis not present

## 2022-02-01 DIAGNOSIS — M50322 Other cervical disc degeneration at C5-C6 level: Secondary | ICD-10-CM | POA: Diagnosis not present

## 2022-02-09 DIAGNOSIS — M50322 Other cervical disc degeneration at C5-C6 level: Secondary | ICD-10-CM | POA: Diagnosis not present

## 2022-02-09 DIAGNOSIS — M9901 Segmental and somatic dysfunction of cervical region: Secondary | ICD-10-CM | POA: Diagnosis not present

## 2022-02-09 DIAGNOSIS — M50323 Other cervical disc degeneration at C6-C7 level: Secondary | ICD-10-CM | POA: Diagnosis not present

## 2022-03-02 DIAGNOSIS — M9901 Segmental and somatic dysfunction of cervical region: Secondary | ICD-10-CM | POA: Diagnosis not present

## 2022-03-02 DIAGNOSIS — M50322 Other cervical disc degeneration at C5-C6 level: Secondary | ICD-10-CM | POA: Diagnosis not present

## 2022-03-02 DIAGNOSIS — M50323 Other cervical disc degeneration at C6-C7 level: Secondary | ICD-10-CM | POA: Diagnosis not present

## 2022-03-16 DIAGNOSIS — M50322 Other cervical disc degeneration at C5-C6 level: Secondary | ICD-10-CM | POA: Diagnosis not present

## 2022-03-16 DIAGNOSIS — M9901 Segmental and somatic dysfunction of cervical region: Secondary | ICD-10-CM | POA: Diagnosis not present

## 2022-03-16 DIAGNOSIS — M50323 Other cervical disc degeneration at C6-C7 level: Secondary | ICD-10-CM | POA: Diagnosis not present

## 2022-03-18 DIAGNOSIS — M25532 Pain in left wrist: Secondary | ICD-10-CM | POA: Diagnosis not present

## 2022-03-28 DIAGNOSIS — G5603 Carpal tunnel syndrome, bilateral upper limbs: Secondary | ICD-10-CM | POA: Diagnosis not present

## 2022-03-28 DIAGNOSIS — M25532 Pain in left wrist: Secondary | ICD-10-CM | POA: Diagnosis not present

## 2022-03-30 DIAGNOSIS — M50322 Other cervical disc degeneration at C5-C6 level: Secondary | ICD-10-CM | POA: Diagnosis not present

## 2022-03-30 DIAGNOSIS — M50323 Other cervical disc degeneration at C6-C7 level: Secondary | ICD-10-CM | POA: Diagnosis not present

## 2022-03-30 DIAGNOSIS — M9901 Segmental and somatic dysfunction of cervical region: Secondary | ICD-10-CM | POA: Diagnosis not present

## 2022-04-13 DIAGNOSIS — G5602 Carpal tunnel syndrome, left upper limb: Secondary | ICD-10-CM | POA: Diagnosis not present

## 2022-04-25 DIAGNOSIS — G5603 Carpal tunnel syndrome, bilateral upper limbs: Secondary | ICD-10-CM | POA: Diagnosis not present

## 2022-04-25 DIAGNOSIS — M25532 Pain in left wrist: Secondary | ICD-10-CM | POA: Diagnosis not present

## 2022-04-27 DIAGNOSIS — M50322 Other cervical disc degeneration at C5-C6 level: Secondary | ICD-10-CM | POA: Diagnosis not present

## 2022-04-27 DIAGNOSIS — M50323 Other cervical disc degeneration at C6-C7 level: Secondary | ICD-10-CM | POA: Diagnosis not present

## 2022-04-27 DIAGNOSIS — M9901 Segmental and somatic dysfunction of cervical region: Secondary | ICD-10-CM | POA: Diagnosis not present

## 2022-04-29 DIAGNOSIS — M25532 Pain in left wrist: Secondary | ICD-10-CM | POA: Diagnosis not present

## 2022-06-06 DIAGNOSIS — G5603 Carpal tunnel syndrome, bilateral upper limbs: Secondary | ICD-10-CM | POA: Diagnosis not present

## 2022-06-06 DIAGNOSIS — S63502A Unspecified sprain of left wrist, initial encounter: Secondary | ICD-10-CM | POA: Diagnosis not present

## 2022-06-11 DIAGNOSIS — G5601 Carpal tunnel syndrome, right upper limb: Secondary | ICD-10-CM | POA: Diagnosis not present

## 2022-07-05 ENCOUNTER — Telehealth: Payer: Self-pay | Admitting: Orthopaedic Surgery

## 2022-07-05 ENCOUNTER — Other Ambulatory Visit (HOSPITAL_COMMUNITY): Payer: Self-pay

## 2022-07-05 ENCOUNTER — Other Ambulatory Visit: Payer: Self-pay | Admitting: Physician Assistant

## 2022-07-05 MED ORDER — AMOXICILLIN 500 MG PO CAPS: ORAL_CAPSULE | ORAL | 0 refills | Status: AC

## 2022-07-05 MED ORDER — AMOXICILLIN 500 MG PO CAPS
ORAL_CAPSULE | ORAL | 0 refills | Status: DC
  Filled 2022-07-05: qty 12, 21d supply, fill #0

## 2022-07-05 NOTE — Telephone Encounter (Signed)
Tried calling patient to advise. No answer.  

## 2022-07-05 NOTE — Telephone Encounter (Signed)
Pt called requesting a script of antibiotics for upcoming dental appt. Please send to new pharmacy [facility] on [address]. Pt phone number is [phone].

## 2022-09-30 DIAGNOSIS — Z1231 Encounter for screening mammogram for malignant neoplasm of breast: Secondary | ICD-10-CM | POA: Diagnosis not present

## 2022-11-16 DIAGNOSIS — H524 Presbyopia: Secondary | ICD-10-CM | POA: Diagnosis not present

## 2022-12-14 DIAGNOSIS — Z01419 Encounter for gynecological examination (general) (routine) without abnormal findings: Secondary | ICD-10-CM | POA: Diagnosis not present

## 2022-12-14 DIAGNOSIS — Z6826 Body mass index (BMI) 26.0-26.9, adult: Secondary | ICD-10-CM | POA: Diagnosis not present

## 2023-01-11 DIAGNOSIS — K080 Exfoliation of teeth due to systemic causes: Secondary | ICD-10-CM | POA: Diagnosis not present

## 2023-01-30 DIAGNOSIS — E785 Hyperlipidemia, unspecified: Secondary | ICD-10-CM | POA: Diagnosis not present

## 2023-01-30 DIAGNOSIS — E559 Vitamin D deficiency, unspecified: Secondary | ICD-10-CM | POA: Diagnosis not present

## 2023-02-06 DIAGNOSIS — Z0000 Encounter for general adult medical examination without abnormal findings: Secondary | ICD-10-CM | POA: Diagnosis not present

## 2023-02-06 DIAGNOSIS — M8580 Other specified disorders of bone density and structure, unspecified site: Secondary | ICD-10-CM | POA: Diagnosis not present

## 2023-02-06 DIAGNOSIS — R202 Paresthesia of skin: Secondary | ICD-10-CM | POA: Diagnosis not present

## 2023-02-06 DIAGNOSIS — E785 Hyperlipidemia, unspecified: Secondary | ICD-10-CM | POA: Diagnosis not present

## 2023-02-06 DIAGNOSIS — R82998 Other abnormal findings in urine: Secondary | ICD-10-CM | POA: Diagnosis not present

## 2023-02-06 DIAGNOSIS — E559 Vitamin D deficiency, unspecified: Secondary | ICD-10-CM | POA: Diagnosis not present

## 2023-05-25 DIAGNOSIS — D225 Melanocytic nevi of trunk: Secondary | ICD-10-CM | POA: Diagnosis not present

## 2023-05-25 DIAGNOSIS — D485 Neoplasm of uncertain behavior of skin: Secondary | ICD-10-CM | POA: Diagnosis not present

## 2023-05-25 DIAGNOSIS — L820 Inflamed seborrheic keratosis: Secondary | ICD-10-CM | POA: Diagnosis not present

## 2023-05-25 DIAGNOSIS — Z1283 Encounter for screening for malignant neoplasm of skin: Secondary | ICD-10-CM | POA: Diagnosis not present

## 2023-07-18 DIAGNOSIS — K080 Exfoliation of teeth due to systemic causes: Secondary | ICD-10-CM | POA: Diagnosis not present

## 2023-10-13 DIAGNOSIS — Z1231 Encounter for screening mammogram for malignant neoplasm of breast: Secondary | ICD-10-CM | POA: Diagnosis not present

## 2023-11-02 DIAGNOSIS — H524 Presbyopia: Secondary | ICD-10-CM | POA: Diagnosis not present

## 2023-11-16 DIAGNOSIS — Z1283 Encounter for screening for malignant neoplasm of skin: Secondary | ICD-10-CM | POA: Diagnosis not present

## 2023-11-16 DIAGNOSIS — D225 Melanocytic nevi of trunk: Secondary | ICD-10-CM | POA: Diagnosis not present

## 2023-11-16 DIAGNOSIS — L820 Inflamed seborrheic keratosis: Secondary | ICD-10-CM | POA: Diagnosis not present

## 2023-12-30 DIAGNOSIS — R051 Acute cough: Secondary | ICD-10-CM | POA: Diagnosis not present

## 2023-12-30 DIAGNOSIS — J020 Streptococcal pharyngitis: Secondary | ICD-10-CM | POA: Diagnosis not present

## 2023-12-30 DIAGNOSIS — Z03818 Encounter for observation for suspected exposure to other biological agents ruled out: Secondary | ICD-10-CM | POA: Diagnosis not present

## 2024-01-07 ENCOUNTER — Emergency Department (HOSPITAL_BASED_OUTPATIENT_CLINIC_OR_DEPARTMENT_OTHER): Payer: Medicare Other

## 2024-01-07 ENCOUNTER — Encounter (HOSPITAL_BASED_OUTPATIENT_CLINIC_OR_DEPARTMENT_OTHER): Payer: Self-pay

## 2024-01-07 ENCOUNTER — Emergency Department (HOSPITAL_BASED_OUTPATIENT_CLINIC_OR_DEPARTMENT_OTHER)
Admission: EM | Admit: 2024-01-07 | Discharge: 2024-01-07 | Disposition: A | Discharge status: Home / Self Care | Payer: Medicare Other | Attending: Emergency Medicine | Admitting: Emergency Medicine

## 2024-01-07 ENCOUNTER — Other Ambulatory Visit: Payer: Self-pay

## 2024-01-07 DIAGNOSIS — M79604 Pain in right leg: Secondary | ICD-10-CM | POA: Insufficient documentation

## 2024-01-07 DIAGNOSIS — M79661 Pain in right lower leg: Secondary | ICD-10-CM | POA: Diagnosis not present

## 2024-01-07 DIAGNOSIS — M7989 Other specified soft tissue disorders: Secondary | ICD-10-CM | POA: Diagnosis not present

## 2024-01-07 NOTE — ED Provider Notes (Signed)
[facility] Provider Note   CSN: [id number] Arrival date & time: 01/07/24  4098     History  Chief Complaint  Patient presents with   Leg Pain    Shannon Andersen is a 68 y.o. 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  She denies any known trauma.  She says she got back recently from a cruise in the [place], with a long flight back from [place], it was also diagnosed with bronchitis and strep and has completed a course of azithromycin and steroids.  She was concerned about a possible blood clot.  She has no prior history of DVT or PE.  Does not report any loss of consciousness.  She is not on anticoagulation but she has been taking a daily aspirin 81 mg as prophylaxis.  HPI     Home Medications Prior to Admission medications   Medication Sig Start Date End Date Taking? Authorizing Provider  omega-3 acid ethyl esters (LOVAZA) 1 g capsule Take 2 capsules by mouth 2 (two) times daily. 10/20/20   [provider]  omega-3 acid ethyl esters (LOVAZA) 1 g capsule TAKE 2 CAPSULES BY MOUTH TWICE A DAY 03/27/20 03/27/21  Avva, Ravisankar, MD  omeprazole (PRILOSEC) 40 MG capsule TAKE 1 CAPSULE BY MOUTH 30 MINUTES BEFORE MORNING MEAL 02/15/21 02/15/22  Baron-Kewley, Jill Side, PA-C  oxycodone (OXY-IR) 5 MG capsule Take 1-2 capsules (5-10 mg total) by mouth every 4 (four) hours as needed. Patient not taking: Reported on 05/19/2021 11/24/20   [name] D, PA-C  rosuvastatin (CRESTOR) 5 MG tablet Take 5 mg by mouth daily.     [provider]  rosuvastatin (CRESTOR) 5 MG tablet TAKE 1 TABLET BY MOUTH ONCE A DAY 02/08/21 02/08/22  Avva, Ravisankar, MD  venlafaxine XR (EFFEXOR-XR) 150 MG 24 hr capsule Take 150 mg by mouth daily.  08/11/15   [provider]  venlafaxine XR (EFFEXOR-XR) 150 MG 24 hr capsule TAKE 1 CAPSULE BY MOUTH ONCE A DAY 11/30/20 11/30/21  Richardean Chimera, MD   Vitamin D, Ergocalciferol, (DRISDOL) 50000 UNITS CAPS capsule Take 50,000 Units by mouth every Monday, Wednesday, and Friday.     [provider]      Allergies    Sulfa antibiotics and Vascepa [icosapent ethyl (epa ethyl ester) (fish)]    Review of Systems   Review of Systems  Physical Exam Updated Vital Signs BP 117/72   Pulse 72   Temp 97.9 F (36.6 C) (Oral)   Resp 16   Ht 5\' 9"  (1.753 m)   Wt 79.4 kg   SpO2 96%   BMI 25.84 kg/m  Physical Exam Constitutional:      General: She is not in acute distress. HENT:     Head: Normocephalic and atraumatic.  Eyes:     Conjunctiva/sclera: Conjunctivae normal.     Pupils: Pupils are equal, round, and reactive to light.  Cardiovascular:     Rate and Rhythm: Normal rate and regular rhythm.  Pulmonary:     Effort: Pulmonary effort is normal. No respiratory distress.  Musculoskeletal:        General: No swelling or tenderness.  Skin:    General: Skin is warm and dry.  Neurological:     General: No focal deficit present.     Mental Status: She is alert. Mental status is at baseline.  Psychiatric:  Mood and Affect: Mood normal.        Behavior: Behavior normal.     ED Results / Procedures / Treatments   Labs (all labs ordered are listed, but only abnormal results are displayed) Labs Reviewed - No data to display  EKG None  Radiology US Venous Img Lower Unilateral Right Result Date: 01/07/2024 CLINICAL DATA:  Right calf tenderness and swelling. EXAM: RIGHT LOWER EXTREMITY VENOUS DOPPLER ULTRASOUND TECHNIQUE: Gray-scale sonography with compression, as well as color and duplex ultrasound, were performed to evaluate the deep venous system(s) from the level of the common femoral vein through the popliteal and proximal calf veins. COMPARISON:  None Available. FINDINGS: VENOUS Normal compressibility of the common femoral, superficial femoral, and popliteal veins, as well as the visualized calf veins. Visualized  portions of profunda femoral vein and great saphenous vein unremarkable. No filling defects to suggest DVT on grayscale or color Doppler imaging. Doppler waveforms show normal direction of venous flow, normal respiratory plasticity and response to augmentation. Limited views of the contralateral common femoral vein are unremarkable. OTHER None. Limitations: none IMPRESSION: Negative. Electronically Signed   By: Kennith Center M.D.   On: 01/07/2024 11:21    Procedures Procedures    Medications Ordered in ED Medications - No data to display  ED Course/ Medical Decision Making/ A&P                                 Medical Decision Making  Patient is here with right posterior calf pain, atraumatic.  Differential include muscle spasms versus DVT versus other.  No indication for x-ray imaging or CT imaging.  Low suspicion for necrotizing fasciitis, fracture or infection.  Vascular ultrasound did not show any acute thrombus.  She is neurovascularly intact and does not have evidence of claudication and lower extremity.  I recommend follow-up with her PCP for this issue.  If there is persistent calf pain in 2 or 3 weeks her PCP may consider repeating an ultrasound.  However per this current interpretation and report of her ultrasound, appears to have good images particularly of the calf veins and lower extremity veins, with no occlusion  Patient was comfortable with this plan        Final Clinical Impression(s) / ED Diagnoses Final diagnoses:  Right leg pain    Rx / DC Orders ED Discharge Orders     None         Terald Sleeper, MD 01/07/24 1150

## 2024-01-07 NOTE — Discharge Instructions (Signed)
Your ultrasound did not show signs of a blood clot today.  I did recommend you talk to your PCP if you are having persistent cramping pain in your calf in 2 to 3 weeks.  At that point it may be reasonable to repeat an ultrasound, is a very small number of blood clots are missed on initial scan.  But we do not see evidence at this time of a clot to start you on blood thinning medication.  If you have changes in coloration of your leg, blue or purple toes, significantly worsening pain, sudden chest pain, difficulty breathing, or loss of consciousness, please call 911 or return to the ER immediately.

## 2024-01-07 NOTE — ED Triage Notes (Signed)
Reports pain in R calf 2 days. Pain when walking. No obvious swelling or warmth   Recently on cruise, dx w strep and bronchitis, finished prescribed meds.

## 2024-01-09 DIAGNOSIS — M8588 Other specified disorders of bone density and structure, other site: Secondary | ICD-10-CM | POA: Diagnosis not present

## 2024-01-18 ENCOUNTER — Other Ambulatory Visit (HOSPITAL_BASED_OUTPATIENT_CLINIC_OR_DEPARTMENT_OTHER): Payer: Self-pay | Admitting: Obstetrics and Gynecology

## 2024-01-18 DIAGNOSIS — Z8249 Family history of ischemic heart disease and other diseases of the circulatory system: Secondary | ICD-10-CM

## 2024-01-18 DIAGNOSIS — Z6826 Body mass index (BMI) 26.0-26.9, adult: Secondary | ICD-10-CM | POA: Diagnosis not present

## 2024-01-18 DIAGNOSIS — Z01419 Encounter for gynecological examination (general) (routine) without abnormal findings: Secondary | ICD-10-CM | POA: Diagnosis not present

## 2024-01-23 DIAGNOSIS — K080 Exfoliation of teeth due to systemic causes: Secondary | ICD-10-CM | POA: Diagnosis not present

## 2024-01-25 DIAGNOSIS — K080 Exfoliation of teeth due to systemic causes: Secondary | ICD-10-CM | POA: Diagnosis not present

## 2024-01-30 ENCOUNTER — Ambulatory Visit (HOSPITAL_COMMUNITY)
Admission: RE | Admit: 2024-01-30 | Discharge: 2024-01-30 | Disposition: A | Discharge status: Home / Self Care | Payer: Self-pay | Source: Ambulatory Visit | Attending: Obstetrics and Gynecology | Admitting: Obstetrics and Gynecology

## 2024-01-30 DIAGNOSIS — Z8249 Family history of ischemic heart disease and other diseases of the circulatory system: Secondary | ICD-10-CM | POA: Insufficient documentation

## 2024-02-20 DIAGNOSIS — M8580 Other specified disorders of bone density and structure, unspecified site: Secondary | ICD-10-CM | POA: Diagnosis not present

## 2024-02-20 DIAGNOSIS — E785 Hyperlipidemia, unspecified: Secondary | ICD-10-CM | POA: Diagnosis not present

## 2024-02-20 DIAGNOSIS — Z1212 Encounter for screening for malignant neoplasm of rectum: Secondary | ICD-10-CM | POA: Diagnosis not present

## 2024-03-05 DIAGNOSIS — Z0000 Encounter for general adult medical examination without abnormal findings: Secondary | ICD-10-CM | POA: Diagnosis not present

## 2024-03-05 DIAGNOSIS — Z1331 Encounter for screening for depression: Secondary | ICD-10-CM | POA: Diagnosis not present

## 2024-03-05 DIAGNOSIS — Z1339 Encounter for screening examination for other mental health and behavioral disorders: Secondary | ICD-10-CM | POA: Diagnosis not present

## 2024-03-05 DIAGNOSIS — R82998 Other abnormal findings in urine: Secondary | ICD-10-CM | POA: Diagnosis not present

## 2024-03-05 DIAGNOSIS — Z23 Encounter for immunization: Secondary | ICD-10-CM | POA: Diagnosis not present

## 2024-03-05 DIAGNOSIS — J302 Other seasonal allergic rhinitis: Secondary | ICD-10-CM | POA: Diagnosis not present

## 2024-06-04 ENCOUNTER — Encounter (HOSPITAL_BASED_OUTPATIENT_CLINIC_OR_DEPARTMENT_OTHER): Payer: Self-pay

## 2024-06-05 ENCOUNTER — Encounter (HOSPITAL_BASED_OUTPATIENT_CLINIC_OR_DEPARTMENT_OTHER): Payer: Self-pay | Admitting: Nurse Practitioner

## 2024-06-05 ENCOUNTER — Encounter (HOSPITAL_BASED_OUTPATIENT_CLINIC_OR_DEPARTMENT_OTHER): Payer: Self-pay | Admitting: *Deleted

## 2024-06-05 ENCOUNTER — Ambulatory Visit (HOSPITAL_BASED_OUTPATIENT_CLINIC_OR_DEPARTMENT_OTHER): Admitting: Nurse Practitioner

## 2024-06-05 VITALS — BP 124/82 | HR 68 | Ht 69.0 in | Wt 169.8 lb

## 2024-06-05 DIAGNOSIS — I2510 Atherosclerotic heart disease of native coronary artery without angina pectoris: Secondary | ICD-10-CM | POA: Diagnosis not present

## 2024-06-05 DIAGNOSIS — I77819 Aortic ectasia, unspecified site: Secondary | ICD-10-CM | POA: Diagnosis not present

## 2024-06-05 DIAGNOSIS — I288 Other diseases of pulmonary vessels: Secondary | ICD-10-CM

## 2024-06-05 DIAGNOSIS — E785 Hyperlipidemia, unspecified: Secondary | ICD-10-CM

## 2024-06-05 MED ORDER — ROSUVASTATIN CALCIUM 10 MG PO TABS: 10.0000 mg | ORAL_TABLET | Freq: Every day | ORAL | 3 refills | Status: AC

## 2024-06-05 NOTE — Patient Instructions (Addendum)
 Medication Instructions:   INCREASE Rosuvastatin one (1) tablet by mouth ( 10 mg) daily. You can take two (2) of your ( 5 mg) tablets at the same time and use them up.   *If you need a refill on your cardiac medications before your next appointment, please call your pharmacy*  Lab Work:  None ordered.  If you have labs (blood work) drawn today and your tests are completely normal, you will receive your results only by: MyChart Message (if you have MyChart) OR A paper copy in the mail If you have any lab test that is abnormal or we need to change your treatment, we will call you to review the results.  Testing/Procedures:  None ordered.  Follow-Up: At [facility], you and your health needs are our priority.  As part of our continuing mission to provide you with exceptional heart care, our providers are all part of one team.  This team includes your primary Cardiologist (physician) and Advanced Practice Providers or APPs (Physician Assistants and Nurse Practitioners) who all work together to provide you with the care you need, when you need it.  Your next appointment:   As needed    Provider:   Rosaline Bane, NP    We recommend signing up for the patient portal called MyChart.  Sign up information is provided on this After Visit Summary.  MyChart is used to connect with patients for Virtual Visits (Telemedicine).  Patients are able to view lab/test results, encounter notes, upcoming appointments, etc.  Non-urgent messages can be sent to your provider as well.   To learn more about what you can do with MyChart, go to [email or web address].

## 2024-06-05 NOTE — Progress Notes (Signed)
 Cardiology Office Note:  .   Date:  06/05/2024 ID:  Shannon Andersen, DOB Feb 05, 1956, MRN [id number] PCP: Janey Santos, MD [facility] Providers Cardiologist:  None   Patient Profile: .      PMH Coronary artery calcification CT Calcium score 02/18/24 CAC score 6 (47th percentile) LM 0, LAD 0, LCx 0, RCA 6 Hyperlipidemia       History of Present Illness: .   Discussed the use of AI scribe software for clinical note transcription with the patient, who gave verbal consent to proceed.  History of Present Illness Shannon Andersen is a very pleasant 68 year old female who presents for cardiology consult for coronary artery calcifications.  She is a retired Copy.  She had CT calcium score ordered by Dr. Leva, who is her gynecologist.  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  Has been on Lovaza for several years due to elevated triglycerides.  Vascepa caused joint pain.  Lipids have been well-controlled on rosuvastatin 5 mg daily with LDL of 63 in March 2025. No history of smoking, diabetes, or prediabetes. Her diet consists mainly of chicken and salads, with limited red meat intake, although she admits she loves carbs. Her husband has heart disease so they aim to limit saturated fat. Family history includes her father with diabetes and open heart surgery, her mother with rheumatoid arthritis and atrial fibrillation, and her younger brother with prediabetes and recent open heart surgery for a 95% occlusion of the left main artery. Her older brother has high blood pressure. She denies chest pain, shortness of breath, lower extremity edema, fatigue, palpitations, weakness, presyncope, syncope, orthopnea, and PND.   Family history: Her family history includes CAD (age of onset: 41) in her brother; Coronary artery disease (age of onset: 75 - 23) in her father; Diabetes in her father; Rheumatologic  disease in her father.   Discussed the use of AI scribe software for clinical note transcription with the patient, who gave verbal consent to proceed.  ASCVD Risk Score: ASCVD (Atherosclerotic Cardiovascular Disease) Risk Algorithm including Known ASCVD from AHA/ACC from [email or web address]  on 06/05/2024 ** All calculations should be rechecked by clinician prior to use **  RESULT SUMMARY: 10.2 % Risk of cardiovascular event (coronary or stroke death or non-fatal MI or stroke) in next 10 years.   Moderate- to high-intensity statin recommended because 10-year risk >7.5%  INPUTS: History of ASCVD --> 0 = No LDL Cholesterol >=190mg /dL (5.07 mmol/L) --> 0 = No Age --> 67 years Diabetes --> 0 = No Sex --> 0 = Female Total Cholesterol --> 145 mg/dL HDL Cholesterol --> 43 mg/dL Systolic Blood Pressure --> 124 mm Hg Treatment for Hypertension --> 1 = Yes Smoker --> 0 = No Race --> 1 = White  Diet: Mostly chicken, rarely eats red meat Loves salads Admits she loves carbs Rare Etoh Occasional soda   Activity: Frequent gardening, yard work Push mowing   No results found for: LIPOA   ROS: See HPI       Studies Reviewed: SABRA   EKG Interpretation Date/Time:  Wednesday June 05 2024 14:27:10 EDT Ventricular Rate:  68 PR Interval:  100 QRS Duration:  78 QT Interval:  404 QTC Calculation: 429 R Axis:   62  Text Interpretation: Sinus rhythm When compared with ECG of 20-Nov-2020 10:59, PR interval has decreased Confirmed by Percy Browning [phone]) on 06/05/2024 2:53:34 PM  Risk Assessment/Calculations:             Physical Exam:   VS: BP 124/82   Pulse 68   Ht 5' 9 (1.753 m)   Wt 169 lb 12.8 oz (77 kg)   SpO2 96%   BMI 25.08 kg/m   Wt Readings from Last 3 Encounters:  06/05/24 169 lb 12.8 oz (77 kg)  01/07/24 175 lb (79.4 kg)  05/19/21 170 lb (77.1 kg)     GEN: Well nourished, well developed in no acute distress NECK: No JVD; No carotid bruits CARDIAC: RRR, no  murmurs, rubs, gallops RESPIRATORY:  Clear to auscultation without rales, wheezing or rhonchi  ABDOMEN: Soft, non-tender, non-distended EXTREMITIES:  No edema; No deformity   Assessment & Plan Coronary artery calcification CT calcium score 01/30/2024 with CAC score of 6 (47th percentile) with all of calcification in RCA.  We discussed findings in detail.  She is very active with regular yard work including push mowing and does not have chest pain, dyspnea, or other symptoms concerning for angina. EKG reveals NSR with no concerning ST/T abnormality. History of hyperlipidemia which has been well-controlled on low-dose rosuvastatin. History of hypertriglyceridemia which has improved on Lovaza. She was unable to tolerate Vascepa. We discussed considerations for mildly elevated calcium score. No indication for further ischemia evaluation at this time. We discussed consideration of adding 81 mg aspirin, however she takes meloxicam on a regular basis and does not want to add additional NSAID. Focus on secondary prevention including heart healthy mostly plant based diet avoiding saturated fat, processed foods, simple carbohydrates, and sugar along with aiming for at least 150 minutes of moderate intensity exercise each week. Continue rosuvastatin, Lovaza.   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 Encouraged recommendations to maintain good blood pressure control, avoid heavy lifting, and avoid fluoroquinolones like Cipro and Levaquin. A dedicated CT for the aorta is ordered for one year from now.  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 She is asymptomatic. CTA aorta imaging planned for 1 year for evaluation of dilated PA.  Hyperlipidemia LDL Goal < 70 Lipid panel completed 02/20/2023 with total cholesterol 145, HDL 43, LDL 63, and triglycerides 806.  LDL is well controlled at 63 mg/dL with rosuvastatin 5 mg, however recommendation is to  take moderate intensity statin due to elevated ASCVD risk score of 10%. We will increase rosuvastatin to 10 mg. Advised her to notify us  if unable to tolerate. Triglycerides have improved on high potency fish oil, recommend that she continue Lovaza and aim to reduce intake of simple carbohydrates.        Dispo: As needed  Signed, Rosaline Bane, NP-C

## 2024-07-23 DIAGNOSIS — K080 Exfoliation of teeth due to systemic causes: Secondary | ICD-10-CM | POA: Diagnosis not present

## 2024-09-30 DIAGNOSIS — M5116 Intervertebral disc disorders with radiculopathy, lumbar region: Secondary | ICD-10-CM | POA: Diagnosis not present

## 2024-09-30 DIAGNOSIS — M5416 Radiculopathy, lumbar region: Secondary | ICD-10-CM | POA: Diagnosis not present

## 2024-10-18 DIAGNOSIS — Z1231 Encounter for screening mammogram for malignant neoplasm of breast: Secondary | ICD-10-CM | POA: Diagnosis not present

## 2024-10-29 DIAGNOSIS — M5416 Radiculopathy, lumbar region: Secondary | ICD-10-CM | POA: Diagnosis not present

## 2024-10-29 DIAGNOSIS — H524 Presbyopia: Secondary | ICD-10-CM | POA: Diagnosis not present

## 2024-10-31 DIAGNOSIS — M5416 Radiculopathy, lumbar region: Secondary | ICD-10-CM | POA: Diagnosis not present

## 2024-11-04 DIAGNOSIS — M5416 Radiculopathy, lumbar region: Secondary | ICD-10-CM | POA: Diagnosis not present

## 2024-11-06 DIAGNOSIS — M5416 Radiculopathy, lumbar region: Secondary | ICD-10-CM | POA: Diagnosis not present

## 2024-11-11 DIAGNOSIS — M5416 Radiculopathy, lumbar region: Secondary | ICD-10-CM | POA: Diagnosis not present

## 2024-11-14 DIAGNOSIS — M5416 Radiculopathy, lumbar region: Secondary | ICD-10-CM | POA: Diagnosis not present
